# Patient Record
Sex: Male | Born: 1970 | Hispanic: Yes | Marital: Married | Smoking: Never smoker
Health system: Southern US, Community
[De-identification: ages and names within clinical notes are randomized; demographics above are authoritative.]

## PROBLEM LIST (undated history)

## (undated) DIAGNOSIS — I10 Essential (primary) hypertension: Secondary | ICD-10-CM

## (undated) HISTORY — PX: APPENDECTOMY: SHX54

---

## 2004-06-09 ENCOUNTER — Ambulatory Visit: Payer: Self-pay | Admitting: Otolaryngology

## 2005-05-27 ENCOUNTER — Ambulatory Visit: Payer: Self-pay | Admitting: Family Medicine

## 2007-02-21 IMAGING — CR DG ANKLE COMPLETE 3+V*L*
1 series · 6 of 6 positions shown · non-contrast
Comparison: none

REASON FOR EXAM: STATUS POST FALL, ANKLE PAIN
COMMENTS:   CALL REPORT TO [phone] OR FAX TO [phone]

PROCEDURE:     DXR - DXR ANKLE LEFT COMPLETE  - May 27, 2005 [DATE]
RESULT:     No fracture, dislocation or other bony abnormality is seen. The
ankle mortise is well maintained.

[Series 1: view not recorded · 0.17mm/px · 6 of 6 slices shown]
[im 1/6]
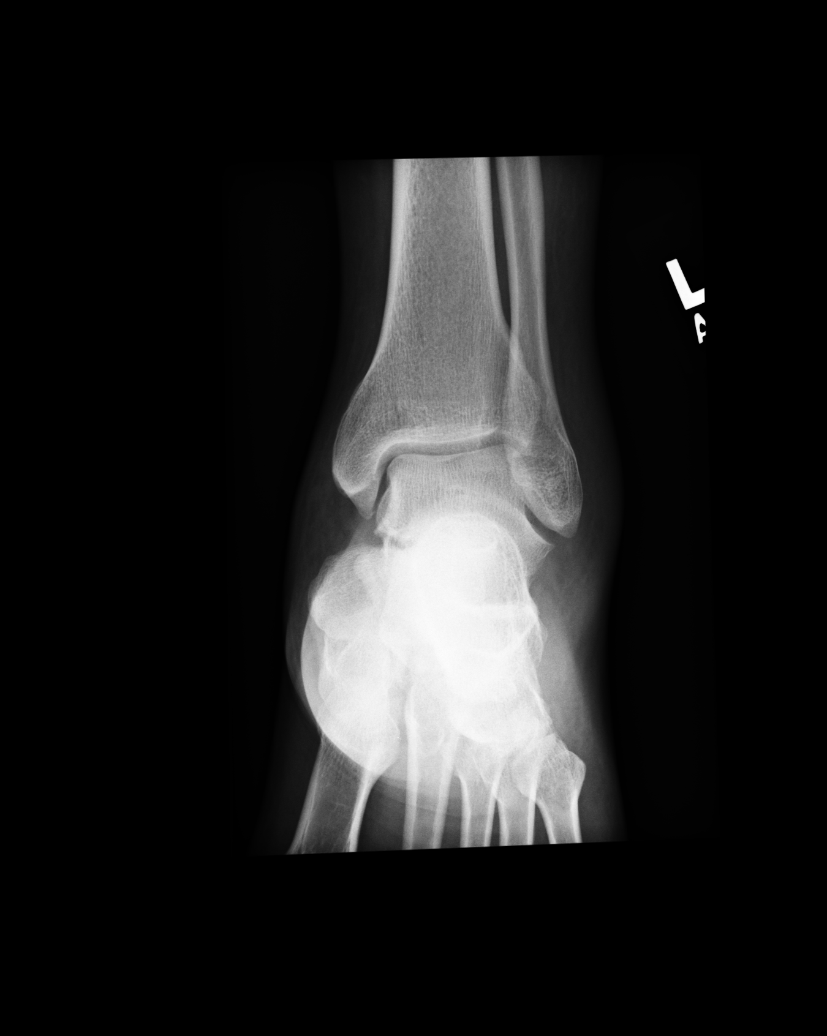
[im 2/6]
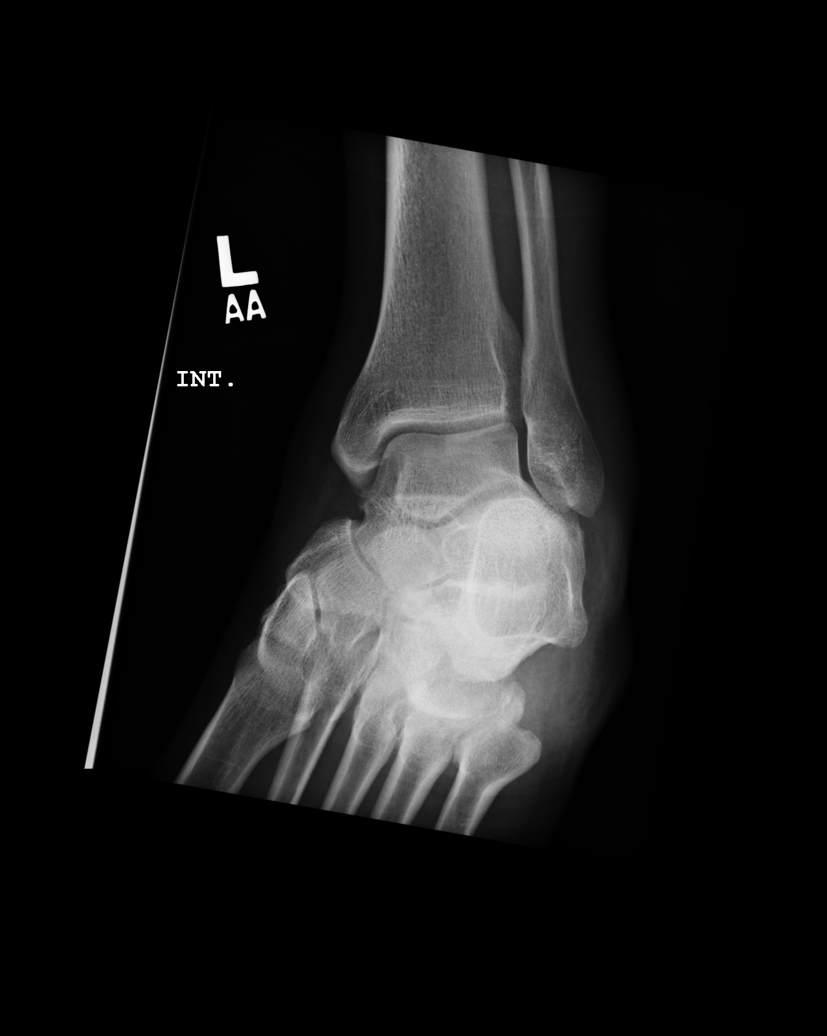
[im 3/6]
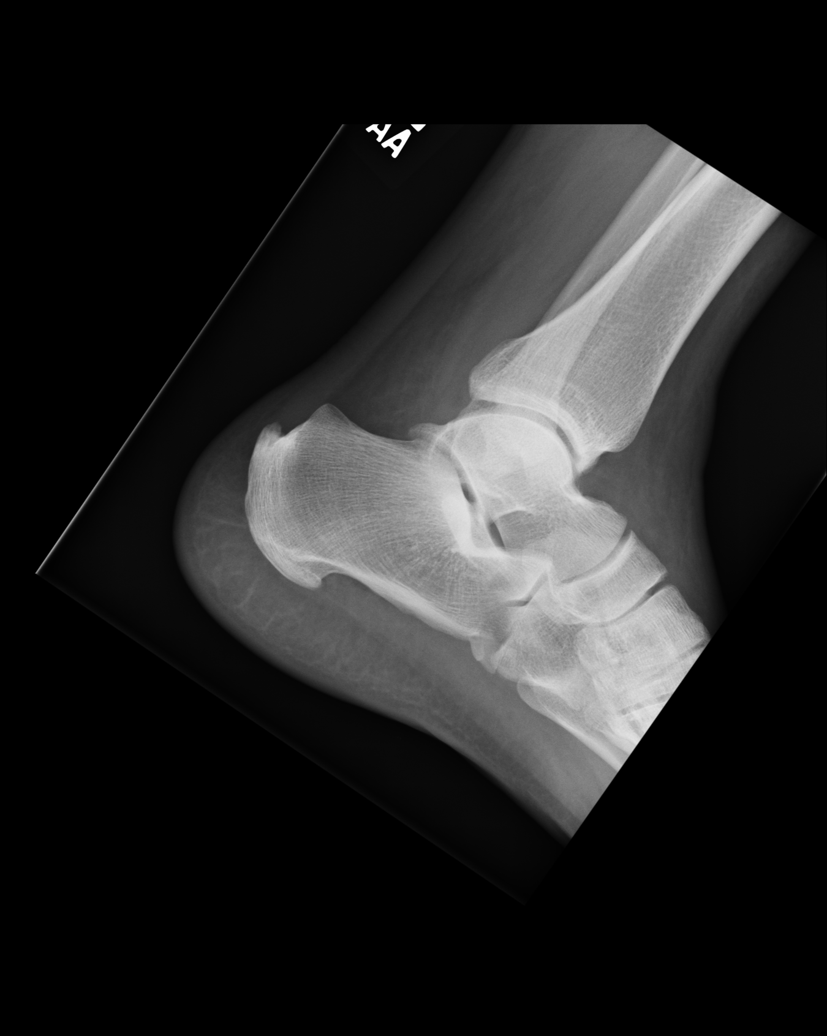
[im 4/6]
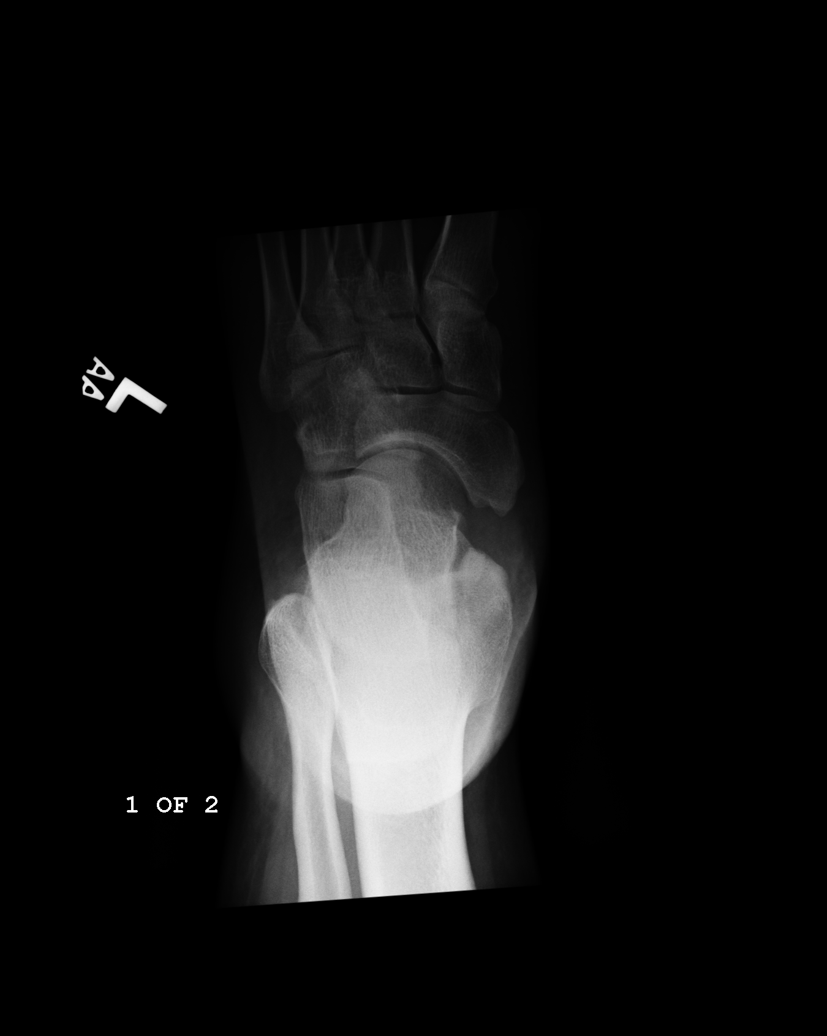
[im 5/6]
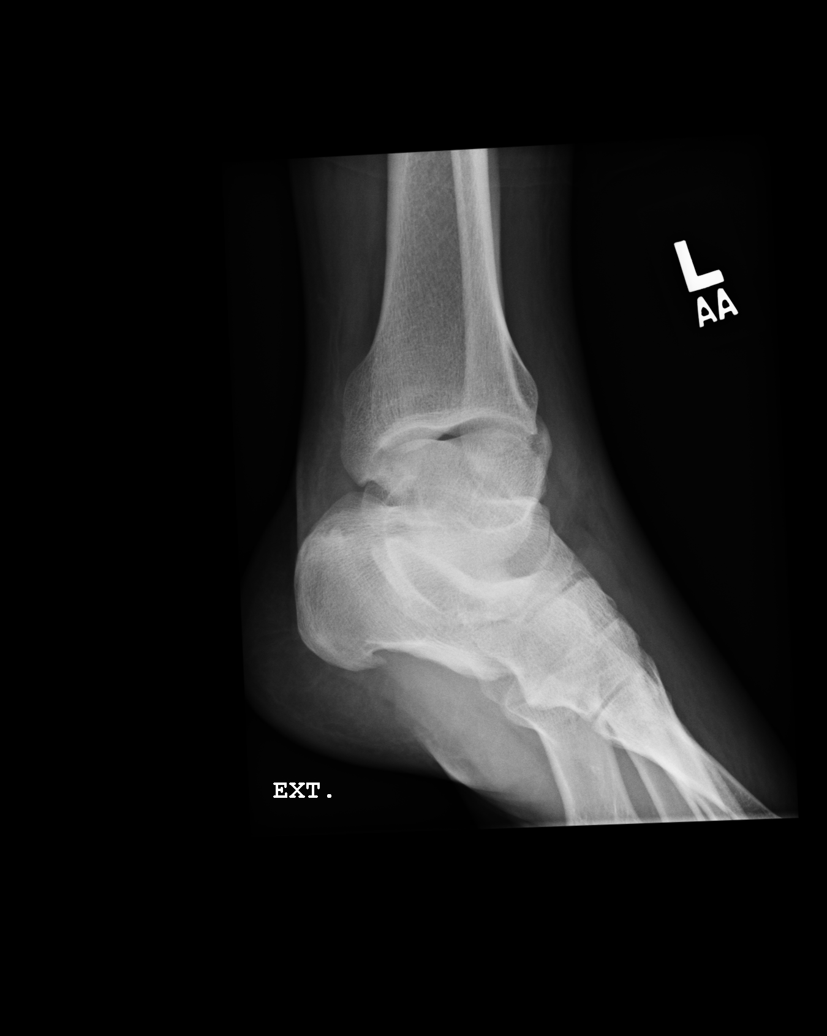
[im 6/6]
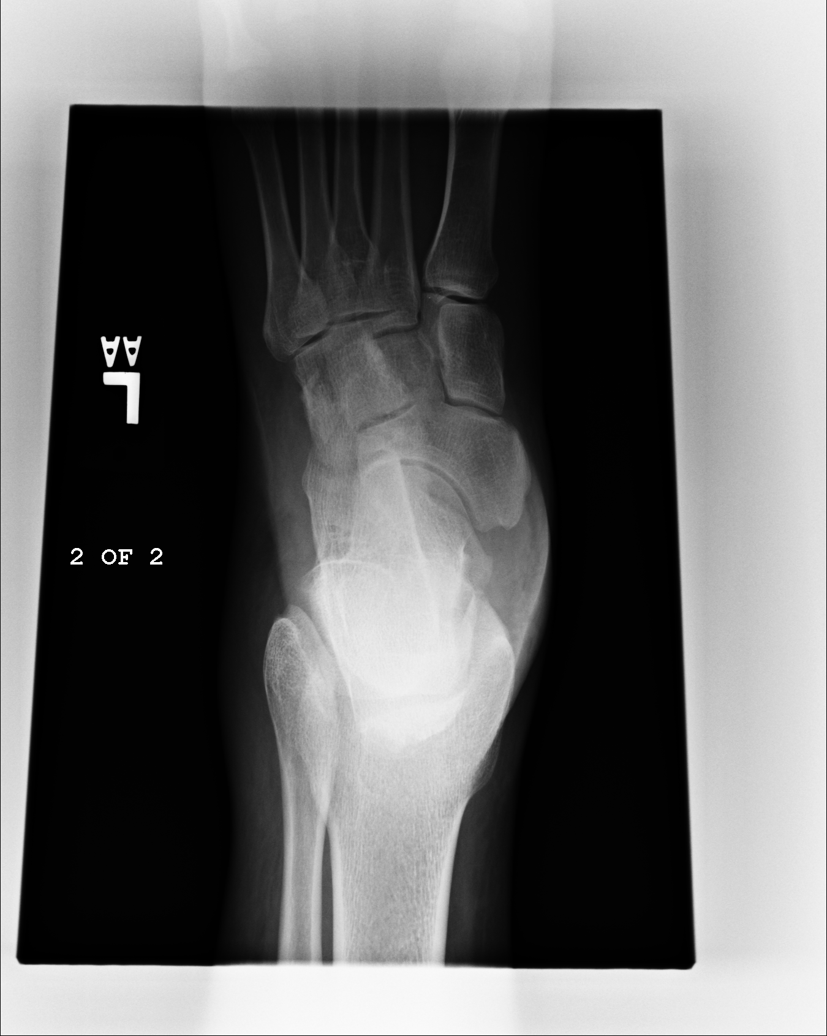

[6 of 6 positions shown; findings below may reference images not displayed]

IMPRESSION: No acute changes are identified.

## 2008-12-18 ENCOUNTER — Ambulatory Visit: Payer: Self-pay | Admitting: Family Medicine

## 2010-09-14 IMAGING — CR DG CHEST 1V
1 series · 1 of 1 positions shown · non-contrast
Comparison: none

REASON FOR EXAM: positive ppd
COMMENTS:

PROCEDURE:     DXR - DXR CHEST 1 VIEWAP OR PA  - December 18, 2008  [DATE]
RESULT:      A single view shows the lung markings are coarse. There is poor
inspiration. There is no cardiomegaly, edema or effusion. The bony
structures are unremarkable.

[view not recorded]
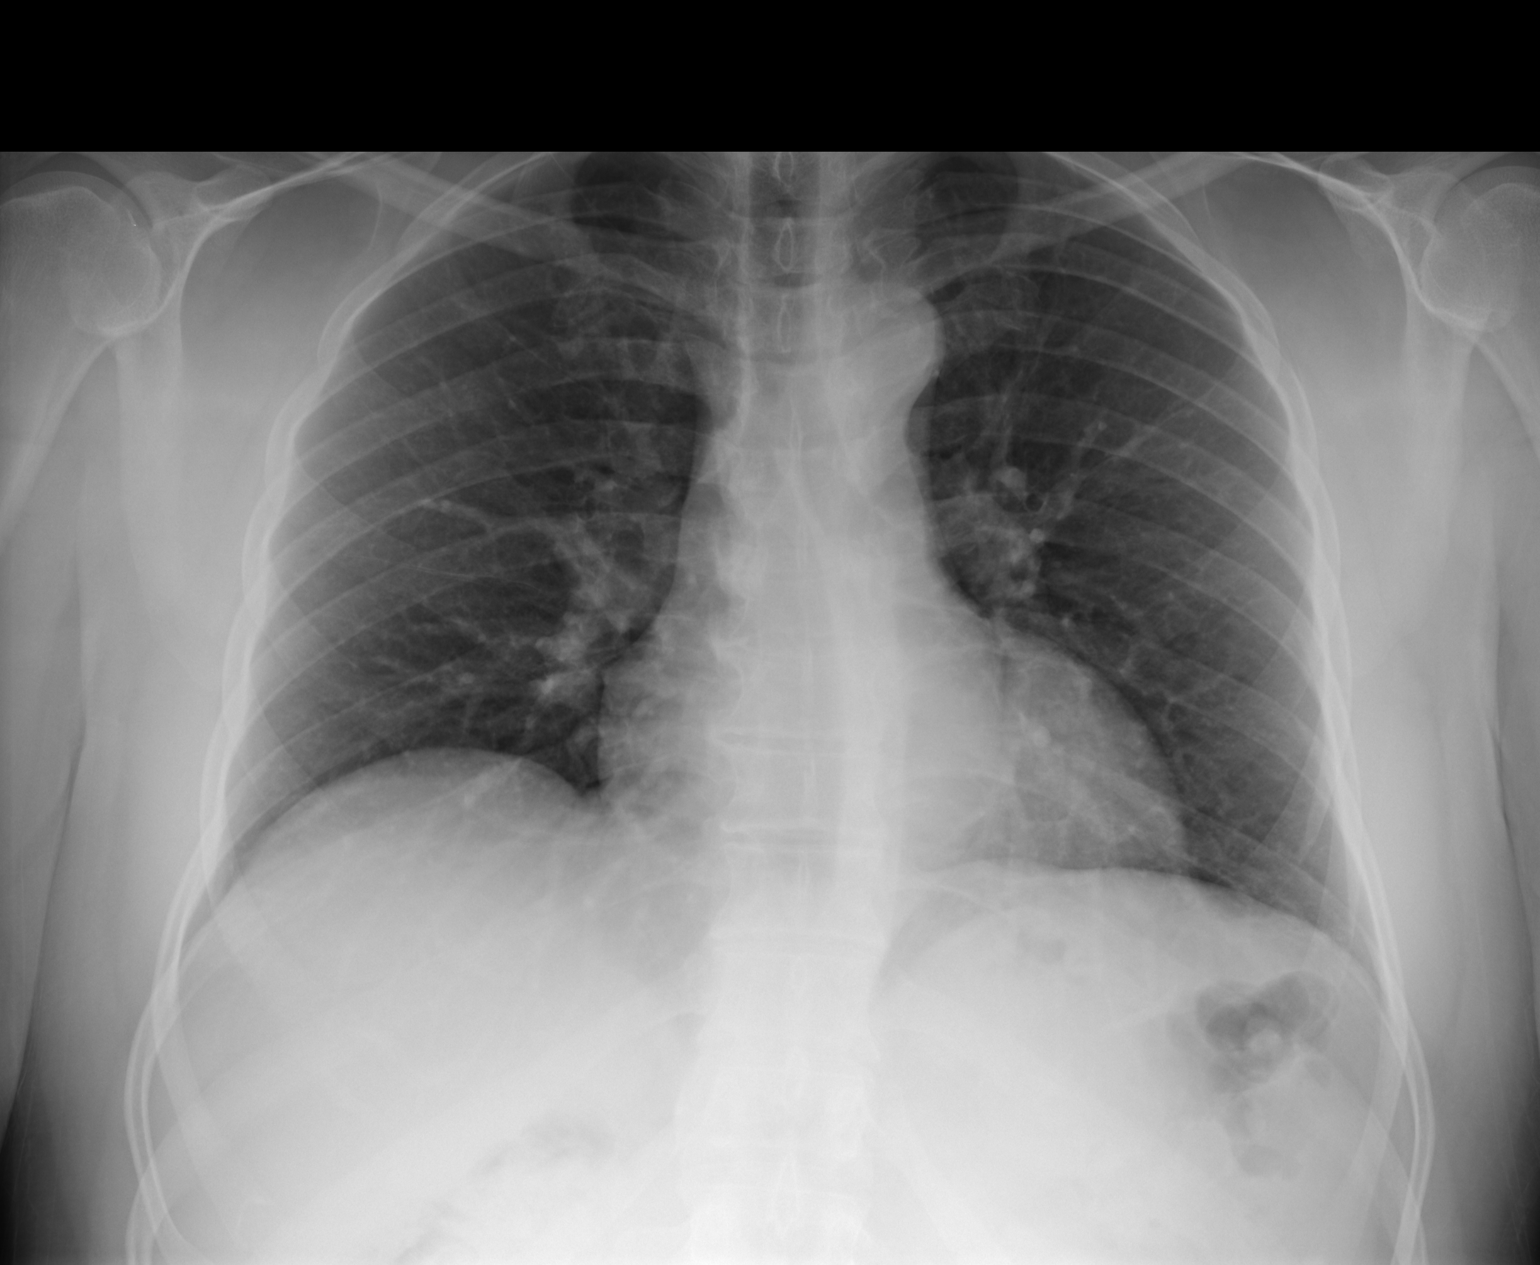

[1 of 1 positions shown; findings below may reference images not displayed]

IMPRESSION: Shallow inspiration with prominence of the lung markings. Followup PA and
lateral views with improved inspiration would be recommended.

## 2020-03-22 ENCOUNTER — Other Ambulatory Visit: Payer: Self-pay

## 2020-03-22 ENCOUNTER — Other Ambulatory Visit
Admission: RE | Admit: 2020-03-22 | Discharge: 2020-03-22 | Disposition: A | Discharge status: Home / Self Care | Payer: 59 | Source: Ambulatory Visit | Attending: Gastroenterology | Admitting: Gastroenterology

## 2020-03-22 DIAGNOSIS — Z01812 Encounter for preprocedural laboratory examination: Secondary | ICD-10-CM | POA: Diagnosis present

## 2020-03-22 DIAGNOSIS — U071 COVID-19: Secondary | ICD-10-CM | POA: Diagnosis not present

## 2020-03-22 LAB — SARS CORONAVIRUS 2 (TAT 6-24 HRS): SARS Coronavirus 2: POSITIVE — AB

## 2020-03-23 ENCOUNTER — Other Ambulatory Visit: Payer: 59

## 2020-03-26 ENCOUNTER — Ambulatory Visit: Admission: RE | Admit: 2020-03-26 | Payer: 59 | Source: Ambulatory Visit

## 2020-03-26 ENCOUNTER — Encounter: Admission: RE | Payer: Self-pay | Source: Ambulatory Visit

## 2020-03-26 HISTORY — DX: Essential (primary) hypertension: I10

## 2020-03-26 SURGERY — COLONOSCOPY WITH PROPOFOL
Anesthesia: General

## 2022-05-10 ENCOUNTER — Emergency Department (HOSPITAL_COMMUNITY): Payer: 59

## 2022-05-10 ENCOUNTER — Encounter (HOSPITAL_COMMUNITY): Payer: Self-pay | Admitting: Emergency Medicine

## 2022-05-10 ENCOUNTER — Inpatient Hospital Stay (HOSPITAL_COMMUNITY)
Admission: EM | Admit: 2022-05-10 | Discharge: 2022-05-15 | DRG: 516 | Disposition: A | Discharge status: Home Health | Payer: 59 | Attending: Surgery | Admitting: Surgery

## 2022-05-10 DIAGNOSIS — S32811A Multiple fractures of pelvis with unstable disruption of pelvic ring, initial encounter for closed fracture: Principal | ICD-10-CM | POA: Diagnosis present

## 2022-05-10 DIAGNOSIS — W3089XA Contact with other specified agricultural machinery, initial encounter: Secondary | ICD-10-CM | POA: Diagnosis not present

## 2022-05-10 DIAGNOSIS — S2232XA Fracture of one rib, left side, initial encounter for closed fracture: Secondary | ICD-10-CM | POA: Diagnosis present

## 2022-05-10 DIAGNOSIS — M545 Low back pain, unspecified: Secondary | ICD-10-CM | POA: Diagnosis not present

## 2022-05-10 DIAGNOSIS — R911 Solitary pulmonary nodule: Secondary | ICD-10-CM | POA: Diagnosis present

## 2022-05-10 DIAGNOSIS — Z6832 Body mass index (BMI) 32.0-32.9, adult: Secondary | ICD-10-CM | POA: Diagnosis not present

## 2022-05-10 DIAGNOSIS — S32048A Other fracture of fourth lumbar vertebra, initial encounter for closed fracture: Secondary | ICD-10-CM | POA: Diagnosis present

## 2022-05-10 DIAGNOSIS — S32810A Multiple fractures of pelvis with stable disruption of pelvic ring, initial encounter for closed fracture: Secondary | ICD-10-CM | POA: Diagnosis not present

## 2022-05-10 DIAGNOSIS — D62 Acute posthemorrhagic anemia: Secondary | ICD-10-CM | POA: Diagnosis not present

## 2022-05-10 DIAGNOSIS — S32111A Minimally displaced Zone I fracture of sacrum, initial encounter for closed fracture: Secondary | ICD-10-CM | POA: Diagnosis present

## 2022-05-10 DIAGNOSIS — I10 Essential (primary) hypertension: Secondary | ICD-10-CM | POA: Diagnosis present

## 2022-05-10 DIAGNOSIS — W309XXA Contact with unspecified agricultural machinery, initial encounter: Secondary | ICD-10-CM

## 2022-05-10 DIAGNOSIS — Z79899 Other long term (current) drug therapy: Secondary | ICD-10-CM

## 2022-05-10 DIAGNOSIS — S0031XA Abrasion of nose, initial encounter: Secondary | ICD-10-CM | POA: Diagnosis present

## 2022-05-10 DIAGNOSIS — Z23 Encounter for immunization: Secondary | ICD-10-CM

## 2022-05-10 DIAGNOSIS — E669 Obesity, unspecified: Secondary | ICD-10-CM | POA: Diagnosis present

## 2022-05-10 DIAGNOSIS — S3282XA Multiple fractures of pelvis without disruption of pelvic ring, initial encounter for closed fracture: Secondary | ICD-10-CM | POA: Diagnosis not present

## 2022-05-10 LAB — CBC
HCT: 40.7 % (ref 39.0–52.0)
Hemoglobin: 13 g/dL (ref 13.0–17.0)
MCH: 28.1 pg (ref 26.0–34.0)
MCHC: 31.9 g/dL (ref 30.0–36.0)
MCV: 87.9 fL (ref 80.0–100.0)
Platelets: 179 10*3/uL (ref 150–400)
RBC: 4.63 MIL/uL (ref 4.22–5.81)
RDW: 13.2 % (ref 11.5–15.5)
WBC: 13.2 10*3/uL — ABNORMAL HIGH (ref 4.0–10.5)
nRBC: 0 % (ref 0.0–0.2)

## 2022-05-10 LAB — COMPREHENSIVE METABOLIC PANEL
ALT: 64 U/L — ABNORMAL HIGH (ref 0–44)
AST: 67 U/L — ABNORMAL HIGH (ref 15–41)
Albumin: 3.8 g/dL (ref 3.5–5.0)
Alkaline Phosphatase: 64 U/L (ref 38–126)
Anion gap: 11 (ref 5–15)
BUN: 17 mg/dL (ref 6–20)
CO2: 24 mmol/L (ref 22–32)
Calcium: 8.7 mg/dL — ABNORMAL LOW (ref 8.9–10.3)
Chloride: 102 mmol/L (ref 98–111)
Creatinine, Ser: 1.29 mg/dL — ABNORMAL HIGH (ref 0.61–1.24)
GFR, Estimated: >60 mL/min (ref >60)
Glucose, Bld: 109 mg/dL — ABNORMAL HIGH (ref 70–99)
Potassium: 3.5 mmol/L (ref 3.5–5.1)
Sodium: 137 mmol/L (ref 135–145)
Total Bilirubin: 0.8 mg/dL (ref 0.3–1.2)
Total Protein: 6.5 g/dL (ref 6.5–8.1)

## 2022-05-10 LAB — URINALYSIS, ROUTINE W REFLEX MICROSCOPIC
Bilirubin Urine: NEGATIVE
Glucose, UA: NEGATIVE mg/dL
Ketones, ur: 40 mg/dL — AB
Leukocytes,Ua: NEGATIVE
Nitrite: NEGATIVE
Protein, ur: 30 mg/dL — AB
Specific Gravity, Urine: 1.01 (ref 1.005–1.030)
pH: 7 (ref 5.0–8.0)

## 2022-05-10 LAB — I-STAT CHEM 8, ED
BUN: 18 mg/dL (ref 6–20)
Calcium, Ion: 1.07 mmol/L — ABNORMAL LOW (ref 1.15–1.40)
Chloride: 105 mmol/L (ref 98–111)
Creatinine, Ser: 1.2 mg/dL (ref 0.61–1.24)
Glucose, Bld: 103 mg/dL — ABNORMAL HIGH (ref 70–99)
HCT: 40 % (ref 39.0–52.0)
Hemoglobin: 13.6 g/dL (ref 13.0–17.0)
Potassium: 3.5 mmol/L (ref 3.5–5.1)
Sodium: 142 mmol/L (ref 135–145)
TCO2: 26 mmol/L (ref 22–32)

## 2022-05-10 LAB — PROTIME-INR
INR: 1.2 (ref 0.8–1.2)
Prothrombin Time: 15 seconds (ref 11.4–15.2)

## 2022-05-10 LAB — MRSA NEXT GEN BY PCR, NASAL: MRSA by PCR Next Gen: NOT DETECTED

## 2022-05-10 LAB — URINALYSIS, MICROSCOPIC (REFLEX)

## 2022-05-10 LAB — LACTIC ACID, PLASMA: Lactic Acid, Venous: 2.1 mmol/L (ref 0.5–1.9)

## 2022-05-10 LAB — PREPARE RBC (CROSSMATCH)

## 2022-05-10 LAB — ABO/RH: ABO/RH(D): O POS

## 2022-05-10 LAB — ETHANOL: Alcohol, Ethyl (B): <10 mg/dL (ref <10)

## 2022-05-10 MED ORDER — LACTATED RINGERS IV SOLN: INTRAVENOUS | Status: DC

## 2022-05-10 MED ORDER — ONDANSETRON 4 MG PO TBDP: 4.0000 mg | ORAL_TABLET | Freq: Four times a day (QID) | ORAL | Status: DC | PRN

## 2022-05-10 MED ORDER — ORAL CARE MOUTH RINSE: 15.0000 mL | OROMUCOSAL | Status: DC | PRN

## 2022-05-10 MED ORDER — SODIUM CHLORIDE 0.9% IV SOLUTION: Freq: Once | INTRAVENOUS | Status: DC

## 2022-05-10 MED ORDER — HYDROMORPHONE HCL 1 MG/ML IJ SOLN
0.5000 mg | INTRAMUSCULAR | Status: DC | PRN
  Administered 2022-05-10 – 2022-05-12 (×8): 0.5 mg via INTRAVENOUS
  Filled 2022-05-10: qty 1
  Filled 2022-05-10: qty 0.5
  Filled 2022-05-10: qty 1
  Filled 2022-05-10: qty 0.5
  Filled 2022-05-10: qty 1
  Filled 2022-05-10 (×3): qty 0.5
  Filled 2022-05-10: qty 1

## 2022-05-10 MED ORDER — CHLORHEXIDINE GLUCONATE CLOTH 2 % EX PADS
6.0000 | MEDICATED_PAD | Freq: Every day | CUTANEOUS | Status: DC
  Administered 2022-05-10 – 2022-05-12 (×3): 6 via TOPICAL

## 2022-05-10 MED ORDER — ACETAMINOPHEN 500 MG PO TABS
1000.0000 mg | ORAL_TABLET | Freq: Three times a day (TID) | ORAL | Status: DC
  Administered 2022-05-10 – 2022-05-13 (×9): 1000 mg via ORAL
  Filled 2022-05-10 (×10): qty 2

## 2022-05-10 MED ORDER — LACTATED RINGERS IV BOLUS
1000.0000 mL | Freq: Once | INTRAVENOUS | Status: AC
  Administered 2022-05-10: 1000 mL via INTRAVENOUS

## 2022-05-10 MED ORDER — FENTANYL CITRATE PF 50 MCG/ML IJ SOSY
50.0000 ug | PREFILLED_SYRINGE | Freq: Once | INTRAMUSCULAR | Status: AC
  Administered 2022-05-10: 50 ug via INTRAVENOUS

## 2022-05-10 MED ORDER — TETANUS-DIPHTH-ACELL PERTUSSIS 5-2.5-18.5 LF-MCG/0.5 IM SUSY
0.5000 mL | PREFILLED_SYRINGE | Freq: Once | INTRAMUSCULAR | Status: AC
  Administered 2022-05-10: 0.5 mL via INTRAMUSCULAR
  Filled 2022-05-10: qty 0.5

## 2022-05-10 MED ORDER — FENTANYL CITRATE (PF) 100 MCG/2ML IJ SOLN
INTRAMUSCULAR | Status: AC
  Filled 2022-05-10: qty 2

## 2022-05-10 MED ORDER — SODIUM CHLORIDE 0.9 % IV SOLN
INTRAVENOUS | Status: AC | PRN
  Administered 2022-05-10: 1000 mL via INTRAVENOUS

## 2022-05-10 MED ORDER — METHOCARBAMOL 500 MG PO TABS
1000.0000 mg | ORAL_TABLET | Freq: Four times a day (QID) | ORAL | Status: DC
  Administered 2022-05-10 – 2022-05-15 (×18): 1000 mg via ORAL
  Filled 2022-05-10 (×18): qty 2

## 2022-05-10 MED ORDER — DOCUSATE SODIUM 100 MG PO CAPS
100.0000 mg | ORAL_CAPSULE | Freq: Two times a day (BID) | ORAL | Status: DC
  Administered 2022-05-10 – 2022-05-11 (×3): 100 mg via ORAL
  Filled 2022-05-10 (×4): qty 1

## 2022-05-10 MED ORDER — ONDANSETRON HCL 4 MG/2ML IJ SOLN: 4.0000 mg | Freq: Four times a day (QID) | INTRAMUSCULAR | Status: DC | PRN

## 2022-05-10 MED ORDER — SODIUM CHLORIDE 0.9% IV SOLUTION: Freq: Once | INTRAVENOUS | Status: AC

## 2022-05-10 MED ORDER — ENOXAPARIN SODIUM 30 MG/0.3ML IJ SOSY
30.0000 mg | PREFILLED_SYRINGE | Freq: Two times a day (BID) | INTRAMUSCULAR | Status: DC
  Administered 2022-05-12 – 2022-05-15 (×6): 30 mg via SUBCUTANEOUS
  Filled 2022-05-10 (×7): qty 0.3

## 2022-05-10 MED ORDER — IOHEXOL 350 MG/ML SOLN
75.0000 mL | Freq: Once | INTRAVENOUS | Status: AC | PRN
  Administered 2022-05-10: 75 mL via INTRAVENOUS

## 2022-05-10 MED ORDER — OXYCODONE HCL 5 MG PO TABS
5.0000 mg | ORAL_TABLET | ORAL | Status: DC | PRN
  Administered 2022-05-10 – 2022-05-15 (×21): 10 mg via ORAL
  Filled 2022-05-10 (×23): qty 2

## 2022-05-10 MED ORDER — FENTANYL CITRATE PF 50 MCG/ML IJ SOSY
PREFILLED_SYRINGE | INTRAMUSCULAR | Status: AC | PRN
  Administered 2022-05-10 (×2): 50 ug via INTRAVENOUS

## 2022-05-10 MED ORDER — LIDOCAINE HCL (PF) 1 % IJ SOLN
INTRAMUSCULAR | Status: AC
  Filled 2022-05-10: qty 30

## 2022-05-10 NOTE — ED Notes (Signed)
Patient transported to CT 

## 2022-05-10 NOTE — ED Notes (Signed)
Pt's BP dropped down to the 70s. EDP and Trauma MD both made aware. 1 liter of fluids. 1 unit of blood ordered.

## 2022-05-10 NOTE — ED Notes (Signed)
[place] contact person

## 2022-05-10 NOTE — ED Notes (Signed)
Trauma Response Nurse Documentation   Matthew Gomez is a 52 y.o. male arriving to [facility] ED via [facility] Gomez  On No antithrombotic. Trauma was activated as a Level 2 by Matthew Gomez. Upgraded to Level 1 by TRN-- due to hypotension in field based on the following trauma criteria Stable femur, humerus, or pelvic fracture via any mechanism except GLF. Trauma team at the bedside on patient arrival.   Patient cleared for CT by Matthew Gomez. Pt transported to CT with trauma response nurse and primary RN Matthew Gomez present to monitor. RN remained with the patient throughout their absence from the department for clinical observation.   GCS 15.  History   Past Medical History:  Diagnosis Date   Hypertension      Past Surgical History:  Procedure Laterality Date   APPENDECTOMY         Initial Focused Assessment (If applicable, or please see trauma documentation):  Airway- clear Breathing- unlabored,  Circulation-- no bleeding/bruising noted-- moderate peripheral pulses GCS -- remains 15  CT's Completed:   CT Head, CT C-Spine, CT Chest w/ contrast, and CT abdomen/pelvis w/ contrast   Interventions:   Plan for disposition:  Admission to Progressive Care   Consults completed:  Orthopaedic Surgeon at 1715-- texted by Matthew Ina, RN from OR- then Matthew Gomez made a formal consult. Matthew Gomez at bedside at 1730.  Event Summary:  See trauma MD and EDP notes.     Bedside handoff with ED RN Matthew Gomez.    Matthew Gomez  Trauma Response RN  Please call TRN at [phone] for further assistance.

## 2022-05-10 NOTE — ED Notes (Signed)
ED TO INPATIENT HANDOFF REPORT  ED Nurse Name and Phone #: Sherryll Burger [phone]  S Name/Age/Gender Matthew Gomez 52 y.o. male Room/Bed: TRAAC/TRAAC  Code Status   Code Status: Full Code  Home/SNF/Other Home Patient oriented to: self, place, time, and situation Is this baseline? Yes   Triage Complete: Triage complete  Chief Complaint Multiple closed pelvic fractures with disruption of pelvic circle ([place]) [S32.810A]  Triage Note PT BIB [facility] after having a full size tractor got stuck and when trying to get it out, fell on top of patient. Patient then pulled out by bystanders. Pt denies LOC.   Pelvis has L sided crepitus. PMS intact.   750 NS en route  110/70 to 90/60  18 g LAC 16 g RAC   Allergies No Known Allergies  Level of Care/Admitting Diagnosis ED Disposition     ED Disposition  Admit   Condition  --   Comment  Hospital Area: [place] [[id number]]  Level of Care: Progressive [102]  Admit to Progressive based on following criteria: MULTISYSTEM THREATS such as stable sepsis, metabolic/electrolyte imbalance with or without encephalopathy that is responding to early treatment.  May admit patient to [name] or [facility] if equivalent level of care is available:: No  Covid Evaluation: Asymptomatic - no recent exposure (last 10 days) testing not required  Diagnosis: Multiple closed pelvic fractures with disruption of pelvic circle ([place]) [id number].ICD-9-CM]  Admitting Physician: Dwan Bolt [id number]  Attending Physician: Dwan Bolt [id number]  Bed request comments: 4NP  Certification:: I certify this patient will need inpatient services for at least 2 midnights  Estimated Length of Stay: 5          B Medical/Surgery History Past Medical History:  Diagnosis Date   Hypertension    Past Surgical History:  Procedure Laterality Date   APPENDECTOMY       A IV Location/Drains/Wounds Patient Lines/Drains/Airways Status      Active Line/Drains/Airways     Name Placement date Placement time Site Days   Peripheral IV 05/10/22 18 G Anterior;Distal;Left;Upper Arm 05/10/22  --  Arm  less than 1   Peripheral IV 05/10/22 16 G Right Antecubital 05/10/22  --  Antecubital  less than 1            Intake/Output Last 24 hours No intake or output data in the 24 hours ending 05/10/22 1807  Labs/Imaging Results for orders placed or performed during the hospital encounter of 05/10/22 (from the past 48 hour(s))  Comprehensive metabolic panel     Status: Abnormal   Collection Time: 05/10/22  4:50 PM  Result Value Ref Range   Sodium 137 135 - 145 mmol/L   Potassium 3.5 3.5 - 5.1 mmol/L   Chloride 102 98 - 111 mmol/L   CO2 24 22 - 32 mmol/L   Glucose, Bld 109 (H) 70 - 99 mg/dL    Comment: Glucose reference range applies only to samples taken after fasting for at least 8 hours.   BUN 17 6 - 20 mg/dL   Creatinine, Ser 1.29 (H) 0.61 - 1.24 mg/dL   Calcium 8.7 (L) 8.9 - 10.3 mg/dL   Total Protein 6.5 6.5 - 8.1 g/dL   Albumin 3.8 3.5 - 5.0 g/dL   AST 67 (H) 15 - 41 U/L   ALT 64 (H) 0 - 44 U/L   Alkaline Phosphatase 64 38 - 126 U/L   Total Bilirubin 0.8 0.3 - 1.2 mg/dL   GFR, Estimated >60 >  60 mL/min    Comment: (NOTE) Calculated using the CKD-EPI Creatinine Equation (2021)    Anion gap 11 5 - 15    Comment: Performed at [facility], [place] [address] [place], [place] [address]  CBC     Status: Abnormal   Collection Time: 05/10/22  4:50 PM  Result Value Ref Range   WBC 13.2 (H) 4.0 - 10.5 K/uL   RBC 4.63 4.22 - 5.81 MIL/uL   Hemoglobin 13.0 13.0 - 17.0 g/dL   HCT 40.7 39.0 - 52.0 %   MCV 87.9 80.0 - 100.0 fL   MCH 28.1 26.0 - 34.0 pg   MCHC 31.9 30.0 - 36.0 g/dL   RDW 13.2 11.5 - 15.5 %   Platelets 179 150 - 400 K/uL   nRBC 0.0 0.0 - 0.2 %    Comment: Performed at [facility], [place] [address], [place], [place] [address]  Ethanol     Status: None   Collection Time: 05/10/22  4:50 PM  Result  Value Ref Range   Alcohol, Ethyl (B) <10 <10 mg/dL    Comment: (NOTE) Lowest detectable limit for serum alcohol is 10 mg/dL.  For medical purposes only. Performed at [facility], [place] [address] [place], [place] [address]   Protime-INR     Status: None   Collection Time: 05/10/22  4:50 PM  Result Value Ref Range   Prothrombin Time 15.0 11.4 - 15.2 seconds   INR 1.2 0.8 - 1.2    Comment: (NOTE) INR goal varies based on device and disease states. Performed at [facility], [place] [address], [place], [place] [address]   Type and screen [place]     Status: None   Collection Time: 05/10/22  4:58 PM  Result Value Ref Range   ABO/RH(D) O POS    Antibody Screen NEG    Sample Expiration      05/13/2022,2359 Performed at [facility], [place] [address] [place], [place] [address]   I-Stat Chem 8, ED     Status: Abnormal   Collection Time: 05/10/22  5:00 PM  Result Value Ref Range   Sodium 142 135 - 145 mmol/L   Potassium 3.5 3.5 - 5.1 mmol/L   Chloride 105 98 - 111 mmol/L   BUN 18 6 - 20 mg/dL   Creatinine, Ser 1.20 0.61 - 1.24 mg/dL   Glucose, Bld 103 (H) 70 - 99 mg/dL    Comment: Glucose reference range applies only to samples taken after fasting for at least 8 hours.   Calcium, Ion 1.07 (L) 1.15 - 1.40 mmol/L   TCO2 26 22 - 32 mmol/L   Hemoglobin 13.6 13.0 - 17.0 g/dL   HCT 40.0 39.0 - 52.0 %   DG Chest Port 1 View  Result Date: 05/10/2022 CLINICAL DATA:  Run over by tractor, initial encounter EXAM: PORTABLE CHEST 1 VIEW COMPARISON:  12/18/2008 FINDINGS: Cardiac shadow is enlarged but accentuated by the portable technique. The overall inspiratory effort is poor although no focal infiltrate is seen. No effusion or pneumothorax is noted. No acute bony abnormality is seen. IMPRESSION: No active disease. Electronically Signed   By: Inez Catalina M.D.   On: 05/10/2022 17:06    Pending Labs Unresulted Labs (From admission, onward)     Start      Ordered   05/11/22 0500  CBC  Tomorrow morning,   R        05/10/22  1747   05/11/22 [id number]  Basic metabolic panel  Tomorrow morning,   R        05/10/22 1747   05/10/22 1744  HIV Antibody (routine testing w rflx)  (HIV Antibody (Routine testing w reflex) panel)  Once,   R        05/10/22 1747   05/10/22 1650  Urinalysis, Routine w reflex microscopic -Urine, Random  (Trauma Panel)  Once,   URGENT       Question:  Specimen Source  Answer:  Urine, Random   05/10/22 1651   05/10/22 1650  Lactic acid, plasma  (Trauma Panel)  Once,   STAT        05/10/22 1651            Vitals/Pain Today's Vitals   05/10/22 1656 05/10/22 1700 05/10/22 1745 05/10/22 1750  BP:  (!) 142/126 (!) 106/91 121/65  Pulse:  70 76 73  Resp:  18 12 14  $ Temp: (!) 95.5 F (35.3 C)  97.8 F (36.6 C) 98.1 F (36.7 C)  SpO2:  100% 100% 99%  PainSc:        Isolation Precautions No active isolations  Medications Medications  Tdap (BOOSTRIX) injection 0.5 mL (has no administration in time range)  fentaNYL (SUBLIMAZE) injection (50 mcg Intravenous Given 05/10/22 1709)  fentaNYL (SUBLIMAZE) 100 MCG/2ML injection (has no administration in time range)  0.9 %  sodium chloride infusion (1,000 mLs Intravenous New Bag/Given 05/10/22 1653)  fentaNYL (SUBLIMAZE) 100 MCG/2ML injection (has no administration in time range)  enoxaparin (LOVENOX) injection 30 mg (has no administration in time range)  lactated ringers infusion (has no administration in time range)  acetaminophen (TYLENOL) tablet 1,000 mg (has no administration in time range)  oxyCODONE (Oxy IR/ROXICODONE) immediate release tablet 5-10 mg (has no administration in time range)  HYDROmorphone (DILAUDID) injection 0.5 mg (has no administration in time range)  methocarbamol (ROBAXIN) tablet 1,000 mg (has no administration in time range)  docusate sodium (COLACE) capsule 100 mg (has no administration in time range)  ondansetron (ZOFRAN-ODT) disintegrating tablet 4 mg  (has no administration in time range)    Or  ondansetron (ZOFRAN) injection 4 mg (has no administration in time range)  lidocaine (PF) (XYLOCAINE) 1 % injection (has no administration in time range)  fentaNYL (SUBLIMAZE) injection 50 mcg (50 mcg Intravenous Given 05/10/22 1759)    Mobility non-ambulatory     Focused Assessments    R Recommendations: See Admitting Provider Note  Report given to:   Additional Notes: pt is on traction.

## 2022-05-10 NOTE — ED Provider Notes (Signed)
[facility] Provider Note  CSN: [id number] Arrival date & time: 05/10/22 1651  Chief Complaint(s) Trauma  HPI Matthew Gomez is a 52 y.o. male with history of hypertension senting to the emergency department after a tractor injury.  The patient reports that he was trying to roll over a tractor that was stuck in the mud when it rolled over and landed on him.  EMS was called.  He reports that he has pain in his low back, bilateral hips.  Reports the pain is severe.  No headaches, nausea vomiting, chest pain, shortness of breath, pain in his arms, pain in his legs.  He had 1 episode of low blood pressure to 90 with EMS but that improved on recheck to [id number] systolic.   Past Medical History Past Medical History:  Diagnosis Date   Hypertension    Patient Active Problem List   Diagnosis Date Noted   Multiple closed pelvic fractures with disruption of pelvic circle ([place]) 05/10/2022   Home Medication(s) Prior to Admission medications   Medication Sig Start Date End Date Taking? Authorizing Provider  meloxicam (MOBIC) 15 MG tablet Take 15 mg by mouth daily.    [provider]  Multiple Vitamin (MULTIVITAMIN) tablet Take 1 tablet by mouth daily.    [provider]  telmisartan-hydrochlorothiazide (MICARDIS HCT) 40-12.5 MG tablet Take 1 tablet by mouth daily.    [provider]                                                                                                                                    Past Surgical History Past Surgical History:  Procedure Laterality Date   APPENDECTOMY     Family History No family history on file.  Social History Social History   Tobacco Use   Smoking status: Never   Smokeless tobacco: Never   Allergies Patient has no known allergies.  Review of Systems Review of Systems  All other systems reviewed and are negative.   Physical Exam Vital Signs  I have reviewed the  triage vital signs BP (!) 142/126   Pulse 70   Temp (!) 95.5 F (35.3 C)   Resp 18   SpO2 100%  Physical Exam Vitals and nursing note reviewed.  Constitutional:      General: He is not in acute distress.    Appearance: Normal appearance.  HENT:     Head: Normocephalic.     Comments: Small less than 1 cm abrasion to the bridge of the nose.    Right Ear: External ear normal.     Left Ear: External ear normal.     Nose:     Comments: No nasal septal hematoma.    Mouth/Throat:     Mouth: Mucous membranes are moist.  Eyes:     Conjunctiva/sclera: Conjunctivae normal.     Pupils: Pupils are equal, round, and reactive to light.  Neck:     Comments: C-collar in place Cardiovascular:     Rate and Rhythm: Normal rate and regular rhythm.     Heart sounds: No murmur heard. Pulmonary:     Effort: Pulmonary effort is normal. No respiratory distress.     Breath sounds: Normal breath sounds.  Chest:     Chest wall: No tenderness.  Abdominal:     General: Abdomen is flat.     Palpations: Abdomen is soft.     Tenderness: There is abdominal tenderness (mild lower).  Musculoskeletal:     Right lower leg: No edema.     Left lower leg: No edema.     Comments: No chest wall tenderness or crepitus.  No focal tenderness or deformity to the bilateral upper extremities, full range of motion at the bilateral shoulders, elbows, wrists and hand.  Range of motion intact of the bilateral hips, knees, ankles and toes, although painful range of motion at the bilateral hips.  Significant tenderness to palpation in the pelvis bilaterally.  Mild midline lumbar tenderness without step-off.  No thoracic tenderness.  Skin:    General: Skin is warm and dry.     Capillary Refill: Capillary refill takes less than 2 seconds.  Neurological:     Mental Status: He is alert and oriented to person, place, and time. Mental status is at baseline.  Psychiatric:        Mood and Affect: Mood normal.        Behavior:  Behavior normal.     ED Results and Treatments Labs (all labs ordered are listed, but only abnormal results are displayed) Labs Reviewed  CBC - Abnormal; Notable for the following components:      Result Value   WBC 13.2 (*)    All other components within normal limits  I-STAT CHEM 8, ED - Abnormal; Notable for the following components:   Glucose, Bld 103 (*)    Calcium, Ion 1.07 (*)    All other components within normal limits  PROTIME-INR  COMPREHENSIVE METABOLIC PANEL  ETHANOL  URINALYSIS, ROUTINE W REFLEX MICROSCOPIC  LACTIC ACID, PLASMA  HIV ANTIBODY (ROUTINE TESTING W REFLEX)  CBC  BASIC METABOLIC PANEL  TYPE AND SCREEN                                                                                                                          Radiology DG Chest Port 1 View  Result Date: 05/10/2022 CLINICAL DATA:  Run over by tractor, initial encounter EXAM: PORTABLE CHEST 1 VIEW COMPARISON:  12/18/2008 FINDINGS: Cardiac shadow is enlarged but accentuated by the portable technique. The overall inspiratory effort is poor although no focal infiltrate is seen. No effusion or pneumothorax is noted. No acute bony abnormality is seen. IMPRESSION: No active disease. Electronically Signed   By: Inez Catalina M.D.   On: 05/10/2022 17:06    Pertinent labs & imaging results that were available during my care of the patient were  reviewed by me and considered in my medical decision making (see MDM for details).  Medications Ordered in ED Medications  Tdap (BOOSTRIX) injection 0.5 mL (has no administration in time range)  fentaNYL (SUBLIMAZE) injection (50 mcg Intravenous Given 05/10/22 1709)  fentaNYL (SUBLIMAZE) 100 MCG/2ML injection (has no administration in time range)  0.9 %  sodium chloride infusion (1,000 mLs Intravenous New Bag/Given 05/10/22 1653)  fentaNYL (SUBLIMAZE) 100 MCG/2ML injection (has no administration in time range)  fentaNYL (SUBLIMAZE) injection 50 mcg (has no  administration in time range)  enoxaparin (LOVENOX) injection 30 mg (has no administration in time range)  lactated ringers infusion (has no administration in time range)  acetaminophen (TYLENOL) tablet 1,000 mg (has no administration in time range)  oxyCODONE (Oxy IR/ROXICODONE) immediate release tablet 5-10 mg (has no administration in time range)  HYDROmorphone (DILAUDID) injection 0.5 mg (has no administration in time range)  methocarbamol (ROBAXIN) tablet 1,000 mg (has no administration in time range)  docusate sodium (COLACE) capsule 100 mg (has no administration in time range)  ondansetron (ZOFRAN-ODT) disintegrating tablet 4 mg (has no administration in time range)    Or  ondansetron (ZOFRAN) injection 4 mg (has no administration in time range)  lidocaine (PF) (XYLOCAINE) 1 % injection (has no administration in time range)                                                                                                                                     Procedures .Critical Care  Performed by: Cristie Hem, MD Authorized by: Cristie Hem, MD   Critical care provider statement:    Critical care time (minutes):  30   Critical care was necessary to treat or prevent imminent or life-threatening deterioration of the following conditions:  Trauma   Critical care was time spent personally by me on the following activities:  Development of treatment plan with patient or surrogate, discussions with consultants, evaluation of patient's response to treatment, examination of patient, ordering and review of laboratory studies, ordering and review of radiographic studies, ordering and performing treatments and interventions, pulse oximetry, re-evaluation of patient's condition and review of old charts   Care discussed with: admitting provider     (including critical care time)  Medical Decision Making / ED Course   MDM:  52 year old male presenting to the emergency department  after tractor injury.  Patient well-appearing, vital signs reassuring, not hypotensive in trauma bay, no tachycardia.  Patient is uncomfortable appearing.  Examination notable for painful range of motion of bilateral hips, tenderness to the pelvis bilaterally, bedside x-ray shows a left iliac fracture as well as superior and inferior pelvic rami fracture on the right.  He does have some widening of his pubic symphysis.  Patient upgraded to level 1 trauma given reported low blood pressure with EMS.  Given mechanism of injury we will proceed with CT scans of the head and neck, chest, abdomen  and pelvis to further characterize injuries.  He also has some lower abdominal tenderness although this is likely related to patient may need IR treatment if he becomes hypotensive.  Pelvic binder is at bedside and will place if patient develops any hemodynamic instability.  Discussed with Dr. [name] with general surgery who agrees with deferring pelvic binder unless patient develops hemodynamic instability.  Given unstable pelvic fracture will likely need to be admitted for further management and monitoring. Clinical Course as of 05/10/22 1758  Sat May 10, 2022  1756 Patient admitted to trauma service. Pending CT scan results. [WS]    Clinical Course User Index [WS] Cristie Hem, MD     Additional history obtained: -Additional history obtained from ems -External records from outside source obtained and reviewed including: Chart review including previous notes, labs, imaging, consultation notes including office visit 01/17/22   Lab Tests: -I ordered, reviewed, and interpreted labs.   The pertinent results include:   Labs Reviewed  CBC - Abnormal; Notable for the following components:      Result Value   WBC 13.2 (*)    All other components within normal limits  I-STAT CHEM 8, ED - Abnormal; Notable for the following components:   Glucose, Bld 103 (*)    Calcium, Ion 1.07 (*)    All other  components within normal limits  PROTIME-INR  COMPREHENSIVE METABOLIC PANEL  ETHANOL  URINALYSIS, ROUTINE W REFLEX MICROSCOPIC  LACTIC ACID, PLASMA  HIV ANTIBODY (ROUTINE TESTING W REFLEX)  CBC  BASIC METABOLIC PANEL  TYPE AND SCREEN    Notable for leukocytosis, likely reactive, normal hemoglobin  EKG   EKG Interpretation  Date/Time:  Saturday May 10 2022 17:32:25 EST Ventricular Rate:  77 PR Interval:  183 QRS Duration: 95 QT Interval:  397 QTC Calculation: 450 R Axis:   48 Text Interpretation: Sinus rhythm Borderline T wave abnormalities Confirmed by Garnette Gunner [phone]) on 05/10/2022 5:33:49 PM         Imaging Studies ordered: I ordered imaging studies including XR pelvis, XR chest On my interpretation imaging demonstrates multiple pelvic fractures I independently visualized and interpreted imaging. I agree with the radiologist interpretation   Medicines ordered and prescription drug management: Meds ordered this encounter  Medications   Tdap (BOOSTRIX) injection 0.5 mL   fentaNYL (SUBLIMAZE) injection   fentaNYL (SUBLIMAZE) 100 MCG/2ML injection    Kevan Rosebush Y: cabinet override   0.9 %  sodium chloride infusion   fentaNYL (SUBLIMAZE) 100 MCG/2ML injection    Kevan Rosebush Y: cabinet override   fentaNYL (SUBLIMAZE) injection 50 mcg   enoxaparin (LOVENOX) injection 30 mg   lactated ringers infusion   acetaminophen (TYLENOL) tablet 1,000 mg   oxyCODONE (Oxy IR/ROXICODONE) immediate release tablet 5-10 mg   HYDROmorphone (DILAUDID) injection 0.5 mg   methocarbamol (ROBAXIN) tablet 1,000 mg   docusate sodium (COLACE) capsule 100 mg   OR Linked Order Group    ondansetron (ZOFRAN-ODT) disintegrating tablet 4 mg    ondansetron (ZOFRAN) injection 4 mg   lidocaine (PF) (XYLOCAINE) 1 % injection    Anello, Elexes A: cabinet override    -I have reviewed the patients home medicines and have made adjustments as needed   Consultations Obtained: I  requested consultation with the trauma surgeon,  and discussed lab and imaging findings as well as pertinent plan - they recommend: admit to their service   Cardiac Monitoring: The patient was maintained on a cardiac monitor.  I personally viewed and interpreted the  cardiac monitored which showed an underlying rhythm of: NSR  Social Determinants of Health:  Diagnosis or treatment significantly limited by social determinants of health: obesity   Reevaluation: After the interventions noted above, I reevaluated the patient and found that they have improved  Co morbidities that complicate the patient evaluation  Past Medical History:  Diagnosis Date   Hypertension       Dispostion: Disposition decision including need for hospitalization was considered, and patient admitted to the hospital.    Final Clinical Impression(s) / ED Diagnoses Final diagnoses:  Multiple closed fractures of pelvis with unstable disruption of pelvic ring, initial encounter [facility])  Accident caused by farm tractor, initial encounter     This chart was dictated using voice recognition software.  Despite best efforts to proofread,  errors can occur which can change the documentation meaning.    Cristie Hem, MD 05/10/22 1758
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
## 2022-05-11 LAB — BASIC METABOLIC PANEL
Anion gap: 10 (ref 5–15)
BUN: 21 mg/dL — ABNORMAL HIGH (ref 6–20)
CO2: 24 mmol/L (ref 22–32)
Calcium: 8.3 mg/dL — ABNORMAL LOW (ref 8.9–10.3)
Chloride: 102 mmol/L (ref 98–111)
Creatinine, Ser: 1.13 mg/dL (ref 0.61–1.24)
GFR, Estimated: >60 mL/min (ref >60)
Glucose, Bld: 125 mg/dL — ABNORMAL HIGH (ref 70–99)
Potassium: 4.1 mmol/L (ref 3.5–5.1)
Sodium: 136 mmol/L (ref 135–145)

## 2022-05-11 LAB — CBC
HCT: 36.8 % — ABNORMAL LOW (ref 39.0–52.0)
Hemoglobin: 12.3 g/dL — ABNORMAL LOW (ref 13.0–17.0)
MCH: 28.1 pg (ref 26.0–34.0)
MCHC: 33.4 g/dL (ref 30.0–36.0)
MCV: 84 fL (ref 80.0–100.0)
Platelets: 147 10*3/uL — ABNORMAL LOW (ref 150–400)
RBC: 4.38 MIL/uL (ref 4.22–5.81)
RDW: 13.8 % (ref 11.5–15.5)
WBC: 7.4 10*3/uL (ref 4.0–10.5)
nRBC: 0 % (ref 0.0–0.2)

## 2022-05-11 LAB — SURGICAL PCR SCREEN
MRSA, PCR: NEGATIVE
Staphylococcus aureus: NEGATIVE

## 2022-05-11 LAB — HIV ANTIBODY (ROUTINE TESTING W REFLEX): HIV Screen 4th Generation wRfx: NONREACTIVE

## 2022-05-11 NOTE — Progress Notes (Signed)
Subjective:    Patient reports pain as mild.  Feeling well this am.   Objective: Vital signs in last 24 hours: Temp:  [95.5 F (35.3 C)-100 F (37.8 C)] 98 F (36.7 C) (02/18 0800) Pulse Rate:  [59-152] 73 (02/18 0800) Resp:  [10-22] 16 (02/18 0800) BP: (89-142)/(54-126) 115/72 (02/18 0800) SpO2:  [91 %-100 %] 91 % (02/18 0800) Weight:  [98.4 kg-109.8 kg] 98.4 kg (02/17 2000)  Intake/Output from previous day: 02/17 0701 - 02/18 0700 In: 2009.2 [P.O.:120; I.V.:510.8; Blood:378.3; IV Piggyback:1000] Out: 750 [Urine:750] Intake/Output this shift: Total I/O In: 50 [I.V.:50] Out: 85 [Urine:85]  Recent Labs    05/10/22 1650 05/10/22 1700 05/11/22 0500  HGB 13.0 13.6 12.3*   Recent Labs    05/10/22 1650 05/10/22 1700 05/11/22 0500  WBC 13.2*  --  7.4  RBC 4.63  --  4.38  HCT 40.7 40.0 36.8*  PLT 179  --  147*   Recent Labs    05/10/22 1650 05/10/22 1700 05/11/22 0500  NA 137 142 136  K 3.5 3.5 4.1  CL 102 105 102  CO2 24  --  24  BUN 17 18 21*  CREATININE 1.29* 1.20 1.13  GLUCOSE 109* 103* 125*  CALCIUM 8.7*  --  8.3*   Recent Labs    05/10/22 1650  INR 1.2    Neurologically intact Neurovascular intact Sensation intact distally Intact pulses distally Incision: scant drainage No cellulitis present Compartment soft No skin breakdown around pinsites   Assessment/Plan:    PLAN Continue traction to LLE Dr. Latanya Maudlin likely to take over care tomorrow Ok to resume diet today, but please make NPO after midnight for ? Surgery tomorrow       Aundra Dubin 05/11/2022, 9:44 AM

## 2022-05-11 NOTE — Plan of Care (Signed)
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
## 2022-05-12 ENCOUNTER — Inpatient Hospital Stay (HOSPITAL_COMMUNITY): Payer: 59 | Admitting: Certified Registered"

## 2022-05-12 ENCOUNTER — Inpatient Hospital Stay (HOSPITAL_COMMUNITY): Payer: 59

## 2022-05-12 ENCOUNTER — Encounter (HOSPITAL_COMMUNITY): Admission: EM | Disposition: A | Discharge status: Home Health | Payer: Self-pay | Source: Home / Self Care

## 2022-05-12 ENCOUNTER — Other Ambulatory Visit: Payer: Self-pay

## 2022-05-12 ENCOUNTER — Encounter (HOSPITAL_COMMUNITY): Payer: Self-pay | Admitting: Surgery

## 2022-05-12 DIAGNOSIS — I10 Essential (primary) hypertension: Secondary | ICD-10-CM | POA: Diagnosis not present

## 2022-05-12 DIAGNOSIS — S3282XA Multiple fractures of pelvis without disruption of pelvic ring, initial encounter for closed fracture: Secondary | ICD-10-CM | POA: Diagnosis not present

## 2022-05-12 HISTORY — PX: ORIF PELVIC FRACTURE WITH PERCUTANEOUS SCREWS: SHX6800

## 2022-05-12 SURGERY — CLOSED REDUCTION, PELVIS, WITH PERCUTANEOUS FIXATION
Anesthesia: General | Site: Pelvis | Laterality: Bilateral

## 2022-05-12 MED ORDER — HYDROMORPHONE HCL 1 MG/ML IJ SOLN
INTRAMUSCULAR | Status: AC
  Filled 2022-05-12: qty 1

## 2022-05-12 MED ORDER — LIDOCAINE 2% (20 MG/ML) 5 ML SYRINGE
INTRAMUSCULAR | Status: DC | PRN
  Administered 2022-05-12: 40 mg via INTRAVENOUS

## 2022-05-12 MED ORDER — KETAMINE HCL 50 MG/5ML IJ SOSY
PREFILLED_SYRINGE | INTRAMUSCULAR | Status: AC
  Filled 2022-05-12: qty 5

## 2022-05-12 MED ORDER — PROPOFOL 10 MG/ML IV BOLUS
INTRAVENOUS | Status: AC
  Filled 2022-05-12: qty 20

## 2022-05-12 MED ORDER — POLYETHYLENE GLYCOL 3350 17 G PO PACK: 17.0000 g | PACK | Freq: Every day | ORAL | Status: DC | PRN

## 2022-05-12 MED ORDER — PROMETHAZINE HCL 25 MG/ML IJ SOLN: 6.2500 mg | INTRAMUSCULAR | Status: DC | PRN

## 2022-05-12 MED ORDER — LIDOCAINE 2% (20 MG/ML) 5 ML SYRINGE
INTRAMUSCULAR | Status: AC
  Filled 2022-05-12: qty 5

## 2022-05-12 MED ORDER — FENTANYL CITRATE (PF) 250 MCG/5ML IJ SOLN
INTRAMUSCULAR | Status: AC
  Filled 2022-05-12: qty 5

## 2022-05-12 MED ORDER — ROCURONIUM BROMIDE 10 MG/ML (PF) SYRINGE
PREFILLED_SYRINGE | INTRAVENOUS | Status: AC
  Filled 2022-05-12: qty 10

## 2022-05-12 MED ORDER — MIDAZOLAM HCL 2 MG/2ML IJ SOLN
INTRAMUSCULAR | Status: DC | PRN
  Administered 2022-05-12: 2 mg via INTRAVENOUS

## 2022-05-12 MED ORDER — 0.9 % SODIUM CHLORIDE (POUR BTL) OPTIME
TOPICAL | Status: DC | PRN
  Administered 2022-05-12: 1000 mL

## 2022-05-12 MED ORDER — HYDROMORPHONE HCL 1 MG/ML IJ SOLN
0.2500 mg | INTRAMUSCULAR | Status: DC | PRN
  Administered 2022-05-12 (×3): 0.5 mg via INTRAVENOUS

## 2022-05-12 MED ORDER — METOCLOPRAMIDE HCL 5 MG/ML IJ SOLN: 5.0000 mg | Freq: Three times a day (TID) | INTRAMUSCULAR | Status: DC | PRN

## 2022-05-12 MED ORDER — ONDANSETRON HCL 4 MG/2ML IJ SOLN
INTRAMUSCULAR | Status: DC | PRN
  Administered 2022-05-12: 4 mg via INTRAVENOUS

## 2022-05-12 MED ORDER — CEFAZOLIN SODIUM-DEXTROSE 2-4 GM/100ML-% IV SOLN
2.0000 g | Freq: Once | INTRAVENOUS | Status: AC
  Administered 2022-05-12: 2 g via INTRAVENOUS
  Filled 2022-05-12: qty 100

## 2022-05-12 MED ORDER — DOCUSATE SODIUM 100 MG PO CAPS
100.0000 mg | ORAL_CAPSULE | Freq: Two times a day (BID) | ORAL | Status: DC
  Administered 2022-05-12 – 2022-05-15 (×6): 100 mg via ORAL
  Filled 2022-05-12 (×5): qty 1

## 2022-05-12 MED ORDER — DEXAMETHASONE SODIUM PHOSPHATE 10 MG/ML IJ SOLN
INTRAMUSCULAR | Status: DC | PRN
  Administered 2022-05-12: 10 mg via INTRAVENOUS

## 2022-05-12 MED ORDER — ROCURONIUM BROMIDE 10 MG/ML (PF) SYRINGE
PREFILLED_SYRINGE | INTRAVENOUS | Status: DC | PRN
  Administered 2022-05-12: 60 mg via INTRAVENOUS
  Administered 2022-05-12: 20 mg via INTRAVENOUS

## 2022-05-12 MED ORDER — PHENYLEPHRINE 80 MCG/ML (10ML) SYRINGE FOR IV PUSH (FOR BLOOD PRESSURE SUPPORT)
PREFILLED_SYRINGE | INTRAVENOUS | Status: DC | PRN
  Administered 2022-05-12 (×4): 80 ug via INTRAVENOUS

## 2022-05-12 MED ORDER — HYDROMORPHONE HCL 1 MG/ML IJ SOLN
0.5000 mg | Freq: Once | INTRAMUSCULAR | Status: AC
  Administered 2022-05-12: 0.5 mg via INTRAVENOUS

## 2022-05-12 MED ORDER — ONDANSETRON HCL 4 MG/2ML IJ SOLN
INTRAMUSCULAR | Status: AC
  Filled 2022-05-12: qty 2

## 2022-05-12 MED ORDER — SUGAMMADEX SODIUM 200 MG/2ML IV SOLN
INTRAVENOUS | Status: DC | PRN
  Administered 2022-05-12: 200 mg via INTRAVENOUS

## 2022-05-12 MED ORDER — PROPOFOL 10 MG/ML IV BOLUS
INTRAVENOUS | Status: DC | PRN
  Administered 2022-05-12: 200 mg via INTRAVENOUS

## 2022-05-12 MED ORDER — CEFAZOLIN SODIUM-DEXTROSE 2-4 GM/100ML-% IV SOLN
2.0000 g | Freq: Three times a day (TID) | INTRAVENOUS | Status: AC
  Administered 2022-05-12 – 2022-05-13 (×3): 2 g via INTRAVENOUS
  Filled 2022-05-12 (×3): qty 100

## 2022-05-12 MED ORDER — PHENYLEPHRINE HCL-NACL 20-0.9 MG/250ML-% IV SOLN
INTRAVENOUS | Status: DC | PRN
  Administered 2022-05-12: 25 ug/min via INTRAVENOUS

## 2022-05-12 MED ORDER — ACETAMINOPHEN 10 MG/ML IV SOLN
INTRAVENOUS | Status: AC
  Filled 2022-05-12: qty 100

## 2022-05-12 MED ORDER — MIDAZOLAM HCL 2 MG/2ML IJ SOLN
INTRAMUSCULAR | Status: AC
  Filled 2022-05-12: qty 2

## 2022-05-12 MED ORDER — LACTATED RINGERS IV SOLN: INTRAVENOUS | Status: DC

## 2022-05-12 MED ORDER — CHLORHEXIDINE GLUCONATE 0.12 % MT SOLN: 15.0000 mL | Freq: Once | OROMUCOSAL | Status: AC

## 2022-05-12 MED ORDER — DEXAMETHASONE SODIUM PHOSPHATE 10 MG/ML IJ SOLN
INTRAMUSCULAR | Status: AC
  Filled 2022-05-12: qty 1

## 2022-05-12 MED ORDER — FENTANYL CITRATE (PF) 250 MCG/5ML IJ SOLN
INTRAMUSCULAR | Status: DC | PRN
  Administered 2022-05-12 (×3): 50 ug via INTRAVENOUS

## 2022-05-12 MED ORDER — OXYCODONE HCL 5 MG PO TABS: 5.0000 mg | ORAL_TABLET | Freq: Once | ORAL | Status: DC | PRN

## 2022-05-12 MED ORDER — HYDROMORPHONE HCL 1 MG/ML IJ SOLN: 0.5000 mg | INTRAMUSCULAR | Status: DC | PRN

## 2022-05-12 MED ORDER — VANCOMYCIN HCL 1000 MG IV SOLR
INTRAVENOUS | Status: AC
  Filled 2022-05-12: qty 20

## 2022-05-12 MED ORDER — CHLORHEXIDINE GLUCONATE 0.12 % MT SOLN
OROMUCOSAL | Status: AC
  Administered 2022-05-12: 15 mL via OROMUCOSAL
  Filled 2022-05-12: qty 15

## 2022-05-12 MED ORDER — ORAL CARE MOUTH RINSE: 15.0000 mL | Freq: Once | OROMUCOSAL | Status: AC

## 2022-05-12 MED ORDER — METOCLOPRAMIDE HCL 5 MG PO TABS: 5.0000 mg | ORAL_TABLET | Freq: Three times a day (TID) | ORAL | Status: DC | PRN

## 2022-05-12 MED ORDER — KETAMINE HCL 10 MG/ML IJ SOLN
INTRAMUSCULAR | Status: DC | PRN
  Administered 2022-05-12: 10 mg via INTRAVENOUS
  Administered 2022-05-12: 30 mg via INTRAVENOUS

## 2022-05-12 MED ORDER — PHENYLEPHRINE 80 MCG/ML (10ML) SYRINGE FOR IV PUSH (FOR BLOOD PRESSURE SUPPORT)
PREFILLED_SYRINGE | INTRAVENOUS | Status: AC
  Filled 2022-05-12: qty 10

## 2022-05-12 MED ORDER — OXYCODONE HCL 5 MG/5ML PO SOLN: 5.0000 mg | Freq: Once | ORAL | Status: DC | PRN

## 2022-05-12 MED ORDER — ACETAMINOPHEN 10 MG/ML IV SOLN
INTRAVENOUS | Status: DC | PRN
  Administered 2022-05-12: 1000 mg via INTRAVENOUS

## 2022-05-12 SURGICAL SUPPLY — 49 items
BAG COUNTER SPONGE SURGICOUNT (BAG) ×1 IMPLANT
BIT DRILL CANN 4.5MM (BIT) IMPLANT
BIT DRILL QC LONG 5.0 (BIT) IMPLANT
BLADE CLIPPER SURG (BLADE) IMPLANT
BLADE SURG 11 STRL SS (BLADE) ×1 IMPLANT
CHLORAPREP W/TINT 26 (MISCELLANEOUS) ×1 IMPLANT
DERMABOND ADVANCED .7 DNX12 (GAUZE/BANDAGES/DRESSINGS) IMPLANT
DRAPE C-ARM 42X72 X-RAY (DRAPES) ×1 IMPLANT
DRAPE C-ARMOR (DRAPES) ×1 IMPLANT
DRAPE HALF SHEET 40X57 (DRAPES) ×1 IMPLANT
DRAPE INCISE IOBAN 66X45 STRL (DRAPES) ×1 IMPLANT
DRAPE SURG 17X23 STRL (DRAPES) ×6 IMPLANT
DRAPE U-SHAPE 47X51 STRL (DRAPES) ×1 IMPLANT
DRESSING MEPILEX FLEX 4X4 (GAUZE/BANDAGES/DRESSINGS) IMPLANT
DRSG MEPILEX BORDER 4X4 (GAUZE/BANDAGES/DRESSINGS) IMPLANT
DRSG MEPILEX BORDER 4X8 (GAUZE/BANDAGES/DRESSINGS) IMPLANT
DRSG MEPILEX FLEX 4X4 (GAUZE/BANDAGES/DRESSINGS) ×2
DRSG TEGADERM 4X4.75 (GAUZE/BANDAGES/DRESSINGS) IMPLANT
ELECT REM PT RETURN 9FT ADLT (ELECTROSURGICAL) ×1
ELECTRODE REM PT RTRN 9FT ADLT (ELECTROSURGICAL) ×1 IMPLANT
GAUZE SPONGE 2X2 8PLY STRL LF (GAUZE/BANDAGES/DRESSINGS) IMPLANT
GLOVE BIO SURGEON STRL SZ 6.5 (GLOVE) ×3 IMPLANT
GLOVE BIO SURGEON STRL SZ7.5 (GLOVE) ×4 IMPLANT
GLOVE BIOGEL PI IND STRL 6.5 (GLOVE) ×1 IMPLANT
GLOVE BIOGEL PI IND STRL 7.5 (GLOVE) ×1 IMPLANT
GOWN STRL REUS W/ TWL LRG LVL3 (GOWN DISPOSABLE) ×2 IMPLANT
GOWN STRL REUS W/TWL LRG LVL3 (GOWN DISPOSABLE) ×2
GUIDEWIRE 2.0MM (WIRE) IMPLANT
GUIDEWIRE 2.8 THREAD 450 L ST (WIRE) IMPLANT
KIT BASIN OR (CUSTOM PROCEDURE TRAY) ×1 IMPLANT
KIT TURNOVER KIT B (KITS) ×1 IMPLANT
MANIFOLD NEPTUNE II (INSTRUMENTS) ×1 IMPLANT
NS IRRIG 1000ML POUR BTL (IV SOLUTION) ×1 IMPLANT
PACK TOTAL JOINT (CUSTOM PROCEDURE TRAY) ×1 IMPLANT
PACK UNIVERSAL I (CUSTOM PROCEDURE TRAY) ×1 IMPLANT
PAD ARMBOARD 7.5X6 YLW CONV (MISCELLANEOUS) ×2 IMPLANT
SCREW CANN FT 7.5X155 STL (Screw) IMPLANT
SCREW CANN FT 7.5X75 (Screw) IMPLANT
SCREW CANN FT 7.5X90 (Screw) IMPLANT
SCREW CANN LT 7.5X140 (Screw) IMPLANT
SPONGE T-LAP 18X18 ~~LOC~~+RFID (SPONGE) IMPLANT
STAPLER VISISTAT 35W (STAPLE) ×1 IMPLANT
SUCTION FRAZIER HANDLE 10FR (MISCELLANEOUS) ×1
SUCTION TUBE FRAZIER 10FR DISP (MISCELLANEOUS) ×1 IMPLANT
SUT MNCRL AB 3-0 PS2 18 (SUTURE) ×1 IMPLANT
SUT MON AB 2-0 CT1 36 (SUTURE) ×1 IMPLANT
TRAY FOLEY MTR SLVR 16FR STAT (SET/KITS/TRAYS/PACK) IMPLANT
WASHER F/7.5 CANN SCREW (Washer) IMPLANT
WATER STERILE IRR 1000ML POUR (IV SOLUTION) ×1 IMPLANT
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
## 2022-05-12 NOTE — Anesthesia Procedure Notes (Signed)
Procedure Name: Intubation Date/Time: 05/12/2022 12:21 PM  Performed by: Carolan Clines, CRNAPre-anesthesia Checklist: Patient identified, Emergency Drugs available, Suction available and Patient being monitored Patient Re-evaluated:Patient Re-evaluated prior to induction Oxygen Delivery Method: Circle System Utilized Preoxygenation: Pre-oxygenation with 100% oxygen Induction Type: IV induction Ventilation: Mask ventilation without difficulty and Oral airway inserted - appropriate to patient size Laryngoscope Size: Mac and 4 Grade View: Grade II Tube type: Oral Tube size: 7.5 mm Number of attempts: 1 Airway Equipment and Method: Stylet and Oral airway Placement Confirmation: ETT inserted through vocal cords under direct vision, positive ETCO2 and breath sounds checked- equal and bilateral Secured at: 23 cm Tube secured with: Tape Dental Injury: Teeth and Oropharynx as per pre-operative assessment

## 2022-05-12 NOTE — Transfer of Care (Signed)
Immediate Anesthesia Transfer of Care Note  Patient: Matthew Gomez  Procedure(s) Performed: ORIF PELVIC FRACTURE WITH PERCUTANEOUS SCREWS (Bilateral: Pelvis)  Patient Location: PACU  Anesthesia Type:General  Level of Consciousness: drowsy  Airway & Oxygen Therapy: Patient Spontanous Breathing and Patient connected to face mask oxygen  Post-op Assessment: Report given to RN and Post -op Vital signs reviewed and stable  Post vital signs: Reviewed and stable  Last Vitals:  Vitals Value Taken Time  BP 144/80 05/12/22 1427  Temp    Pulse 79 05/12/22 1429  Resp    SpO2 100 % 05/12/22 1429  Vitals shown include unvalidated device data.  Last Pain:  Vitals:   05/12/22 1028  TempSrc: Oral  PainSc: 9       Patients Stated Pain Goal: 4 ([id number])  Complications: No notable events documented.

## 2022-05-12 NOTE — Anesthesia Postprocedure Evaluation (Signed)
Anesthesia Post Note  Patient: Matthew Gomez  Procedure(s) Performed: ORIF PELVIC FRACTURE WITH PERCUTANEOUS SCREWS (Bilateral: Pelvis)     Patient location during evaluation: PACU Anesthesia Type: General Level of consciousness: awake Pain management: pain level controlled Vital Signs Assessment: post-procedure vital signs reviewed and stable Respiratory status: spontaneous breathing, nonlabored ventilation and respiratory function stable Cardiovascular status: blood pressure returned to baseline and stable Postop Assessment: no apparent nausea or vomiting Anesthetic complications: no   No notable events documented.  Last Vitals:  Vitals:   05/12/22 1515 05/12/22 1528  BP: 125/72 133/80  Pulse: 73 77  Resp: 12 17  Temp:  37.1 C  SpO2: 96% 96%    Last Pain:  Vitals:   05/12/22 1528  TempSrc: Oral  PainSc:                  Nilda Simmer
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
## 2022-05-12 NOTE — Interval H&P Note (Signed)
History and Physical Interval Note:  05/12/2022 12:02 PM  Matthew Gomez  has presented today for surgery, with the diagnosis of Pelvic fractures.  The various methods of treatment have been discussed with the patient and family. After consideration of risks, benefits and other options for treatment, the patient has consented to  Procedure(s): ORIF PELVIC FRACTURE WITH PERCUTANEOUS SCREWS (Bilateral) as a surgical intervention.  The patient's history has been reviewed, patient examined, no change in status, stable for surgery.  I have reviewed the patient's chart and labs.  Questions were answered to the patient's satisfaction.     Lennette Bihari P Stacye Noori
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
## 2022-05-13 ENCOUNTER — Encounter (HOSPITAL_COMMUNITY): Payer: Self-pay | Admitting: Student

## 2022-05-13 ENCOUNTER — Inpatient Hospital Stay (HOSPITAL_COMMUNITY): Payer: 59

## 2022-05-13 LAB — CBC
HCT: 27.2 % — ABNORMAL LOW (ref 39.0–52.0)
Hemoglobin: 9.3 g/dL — ABNORMAL LOW (ref 13.0–17.0)
MCH: 28.4 pg (ref 26.0–34.0)
MCHC: 34.2 g/dL (ref 30.0–36.0)
MCV: 83.2 fL (ref 80.0–100.0)
Platelets: 114 10*3/uL — ABNORMAL LOW (ref 150–400)
RBC: 3.27 MIL/uL — ABNORMAL LOW (ref 4.22–5.81)
RDW: 13 % (ref 11.5–15.5)
WBC: 8.4 10*3/uL (ref 4.0–10.5)
nRBC: 0 % (ref 0.0–0.2)

## 2022-05-13 LAB — BASIC METABOLIC PANEL
Anion gap: 8 (ref 5–15)
BUN: 16 mg/dL (ref 6–20)
CO2: 26 mmol/L (ref 22–32)
Calcium: 8 mg/dL — ABNORMAL LOW (ref 8.9–10.3)
Chloride: 99 mmol/L (ref 98–111)
Creatinine, Ser: 1.05 mg/dL (ref 0.61–1.24)
GFR, Estimated: >60 mL/min (ref >60)
Glucose, Bld: 134 mg/dL — ABNORMAL HIGH (ref 70–99)
Potassium: 4.4 mmol/L (ref 3.5–5.1)
Sodium: 133 mmol/L — ABNORMAL LOW (ref 135–145)

## 2022-05-13 LAB — VITAMIN D 25 HYDROXY (VIT D DEFICIENCY, FRACTURES): Vit D, 25-Hydroxy: 17.1 ng/mL — ABNORMAL LOW (ref 30–100)

## 2022-05-13 MED ORDER — POLYETHYLENE GLYCOL 3350 17 G PO PACK
17.0000 g | PACK | Freq: Every day | ORAL | Status: DC
  Administered 2022-05-13 – 2022-05-15 (×3): 17 g via ORAL
  Filled 2022-05-13 (×3): qty 1
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
## 2022-05-13 NOTE — Progress Notes (Signed)
Orthopaedic Trauma Progress Note  SUBJECTIVE: Doing fairly well this morning.  Overall pain controlled.  No chest pain. No SOB. No nausea/vomiting.  Denies any numbness or tingling throughout bilateral lower extremities.  No other complaints.  CT scan of the pelvis done this morning.  Patient has not been up out of bed yet worked with therapy since surgery.  OBJECTIVE:  Vitals:   05/13/22 0310 05/13/22 0751  BP: 102/64 119/85  Pulse: (!) 54 84  Resp: 14 20  Temp: 97.9 F (36.6 C) 97.8 F (36.6 C)  SpO2: 99% 96%    General: Sitting up in bed, no acute distress Respiratory: No increased work of breathing.  Pelvis/BLE: Dressings clean, dry, intact.  Mild swelling and bruising to the scrotum.  No tenderness with palpation over bilateral hips.  Bilateral lower extremities.  Tolerates gentle ankle dorsiflexion plantarflexion bilaterally.  Endorses sensation to light touch bilateral lower extremities.  Neurovascularly intact.  IMAGING: Stable post op imaging.   LABS:  Results for orders placed or performed during the hospital encounter of 05/10/22 (from the past 24 hour(s))  CBC     Status: Abnormal   Collection Time: 05/13/22  3:16 AM  Result Value Ref Range   WBC 8.4 4.0 - 10.5 K/uL   RBC 3.27 (L) 4.22 - 5.81 MIL/uL   Hemoglobin 9.3 (L) 13.0 - 17.0 g/dL   HCT 27.2 (L) 39.0 - 52.0 %   MCV 83.2 80.0 - 100.0 fL   MCH 28.4 26.0 - 34.0 pg   MCHC 34.2 30.0 - 36.0 g/dL   RDW 13.0 11.5 - 15.5 %   Platelets 114 (L) 150 - 400 K/uL   nRBC 0.0 0.0 - 0.2 %  Basic metabolic panel     Status: Abnormal   Collection Time: 05/13/22  3:16 AM  Result Value Ref Range   Sodium 133 (L) 135 - 145 mmol/L   Potassium 4.4 3.5 - 5.1 mmol/L   Chloride 99 98 - 111 mmol/L   CO2 26 22 - 32 mmol/L   Glucose, Bld 134 (H) 70 - 99 mg/dL   BUN 16 6 - 20 mg/dL   Creatinine, Ser 1.05 0.61 - 1.24 mg/dL   Calcium 8.0 (L) 8.9 - 10.3 mg/dL   GFR, Estimated >60 >60 mL/min   Anion gap 8 5 - 15    ASSESSMENT:  Matthew Gomez is a 52 y.o. male, 1 Day Post-Op s/p ORIF PELVIC FRACTURE WITH PERCUTANEOUS SCREWS  CV/Blood loss: Acute blood loss anemia, Hgb 9.3 this morning. Hemodynamically stable  PLAN: Weightbearing: NWB LLE.  WBAT for transfers only RLE ROM: Okay for unrestricted hip and knee range of motion bilaterally Incisional and dressing care: Reinforce dressings as needed  Showering: Hold off on showering for now.  Okay to begin getting incisions wet 05/15/2022 Orthopedic device(s): None  Pain management:  1. Tylenol 1000 mg q 6 hours scheduled 2. Robaxin 1000 mg 4 times daily  3. Oxycodone 5-10 mg q 4 hours PRN 4. Dilaudid 0.5-1 mg q 3 hours PRN VTE prophylaxis: Lovenox, SCDs ID:  Ancef 2gm post op Foley/Lines: Foley in place.  Possible TOV today.  KVO IVFs Impediments to Fracture Healing: Vitamin D level pending, will start supplementation as indicated Dispo: PT/OT evaluation today, dispo pending.  Plan to remove dressings 05/14/2022   D/C recommendations: -Oxycodone, Robaxin, Tylenol for pain control -Eliquis 2 5 mg twice daily x 30 days for DVT prophylaxis -Possible need for Vit D supplementation  Follow - up plan: 2 weeks  after discharge for wound check and repeat x-rays   Contact information:  Katha Hamming MD, Rushie Nyhan PA-C. After hours and holidays please check [email or web address] for group call information for [facility]   Gwinda Passe, PA-C [phone] (office) [email or web address]
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
## 2022-05-14 LAB — TYPE AND SCREEN
ABO/RH(D): O POS
Antibody Screen: NEGATIVE
Unit division: 0
Unit division: 0
Unit division: 0

## 2022-05-14 LAB — CBC
HCT: 28.2 % — ABNORMAL LOW (ref 39.0–52.0)
Hemoglobin: 9.1 g/dL — ABNORMAL LOW (ref 13.0–17.0)
MCH: 27.8 pg (ref 26.0–34.0)
MCHC: 32.3 g/dL (ref 30.0–36.0)
MCV: 86.2 fL (ref 80.0–100.0)
Platelets: 157 10*3/uL (ref 150–400)
RBC: 3.27 MIL/uL — ABNORMAL LOW (ref 4.22–5.81)
RDW: 13.1 % (ref 11.5–15.5)
WBC: 8.5 10*3/uL (ref 4.0–10.5)
nRBC: 0 % (ref 0.0–0.2)

## 2022-05-14 LAB — BASIC METABOLIC PANEL
Anion gap: 7 (ref 5–15)
BUN: 16 mg/dL (ref 6–20)
CO2: 30 mmol/L (ref 22–32)
Calcium: 8 mg/dL — ABNORMAL LOW (ref 8.9–10.3)
Chloride: 100 mmol/L (ref 98–111)
Creatinine, Ser: 0.95 mg/dL (ref 0.61–1.24)
GFR, Estimated: >60 mL/min (ref >60)
Glucose, Bld: 112 mg/dL — ABNORMAL HIGH (ref 70–99)
Potassium: 3.8 mmol/L (ref 3.5–5.1)
Sodium: 137 mmol/L (ref 135–145)

## 2022-05-14 LAB — BPAM RBC
Blood Product Expiration Date: 202403222359
Blood Product Expiration Date: 202403222359
Blood Product Expiration Date: 202403222359
ISSUE DATE / TIME: 202402171910
ISSUE DATE / TIME: 202402171910
Unit Type and Rh: 5100
Unit Type and Rh: 5100
Unit Type and Rh: 5100

## 2022-05-14 MED ORDER — ACETAMINOPHEN 500 MG PO TABS
1000.0000 mg | ORAL_TABLET | Freq: Four times a day (QID) | ORAL | Status: DC
  Administered 2022-05-14 – 2022-05-15 (×5): 1000 mg via ORAL
  Filled 2022-05-14 (×5): qty 2

## 2022-05-14 MED ORDER — KETOROLAC TROMETHAMINE 30 MG/ML IJ SOLN
30.0000 mg | Freq: Four times a day (QID) | INTRAMUSCULAR | Status: DC
  Administered 2022-05-14 – 2022-05-15 (×5): 30 mg via INTRAVENOUS
  Filled 2022-05-14 (×5): qty 1

## 2022-05-14 MED ORDER — VITAMIN D (ERGOCALCIFEROL) 1.25 MG (50000 UNIT) PO CAPS
50000.0000 [IU] | ORAL_CAPSULE | ORAL | Status: DC
  Administered 2022-05-14: 50000 [IU] via ORAL
  Filled 2022-05-14: qty 1

## 2022-05-14 NOTE — Progress Notes (Signed)
Orthopaedic Trauma Progress Note  SUBJECTIVE: Patient sleeping comfortably in bed this afternoon with lights off.  Did not wake him for exam.  Per trauma notes earlier today, having more pain today and was unable to sleep well last night due to the pain.  Some adjustments to his medication were made.    OBJECTIVE:  Vitals:   05/14/22 0748 05/14/22 1141  BP: 119/66 110/60  Pulse: 61 70  Resp: 16 19  Temp: 99 F (37.2 C) 98.7 F (37.1 C)  SpO2: 91% 95%    General: Sleeping comfortably in bed.  No acute distress Respiratory: No increased work of breathing.  Pelvis/BLE: Exam not completed due to patient sleeping  IMAGING: Postoperative pelvic CT scan stable.  LABS:  Results for orders placed or performed during the hospital encounter of 05/10/22 (from the past 24 hour(s))  CBC     Status: Abnormal   Collection Time: 05/14/22 12:15 AM  Result Value Ref Range   WBC 8.5 4.0 - 10.5 K/uL   RBC 3.27 (L) 4.22 - 5.81 MIL/uL   Hemoglobin 9.1 (L) 13.0 - 17.0 g/dL   HCT 28.2 (L) 39.0 - 52.0 %   MCV 86.2 80.0 - 100.0 fL   MCH 27.8 26.0 - 34.0 pg   MCHC 32.3 30.0 - 36.0 g/dL   RDW 13.1 11.5 - 15.5 %   Platelets 157 150 - 400 K/uL   nRBC 0.0 0.0 - 0.2 %  Basic metabolic panel     Status: Abnormal   Collection Time: 05/14/22 12:15 AM  Result Value Ref Range   Sodium 137 135 - 145 mmol/L   Potassium 3.8 3.5 - 5.1 mmol/L   Chloride 100 98 - 111 mmol/L   CO2 30 22 - 32 mmol/L   Glucose, Bld 112 (H) 70 - 99 mg/dL   BUN 16 6 - 20 mg/dL   Creatinine, Ser 0.95 0.61 - 1.24 mg/dL   Calcium 8.0 (L) 8.9 - 10.3 mg/dL   GFR, Estimated >60 >60 mL/min   Anion gap 7 5 - 15    ASSESSMENT: Matthew Gomez is a 52 y.o. male, 2 Days Post-Op s/p ORIF PELVIC FRACTURE WITH PERCUTANEOUS SCREWS  CV/Blood loss: Acute blood loss anemia, Hgb 9.1 this morning. Hemodynamically stable  PLAN: Weightbearing: NWB LLE.  WBAT for transfers only RLE ROM: Okay for unrestricted hip and knee range of motion  bilaterally Incisional and dressing care: Okay to leave incisions open to air change as needed Showering: Hold off on showering for now.  Okay to begin getting incisions wet 05/15/2022 Orthopedic device(s): None  Pain management:  1. Tylenol 1000 mg q 6 hours scheduled 2. Robaxin 1000 mg 4 times daily  3. Oxycodone 5-10 mg q 4 hours PRN 4. Dilaudid 0.5-1 mg q 4 hours PRN 5. Toradol 30 mg every 6 hours x 5 days VTE prophylaxis: Lovenox, SCDs ID:  Ancef 2gm post op completed Foley/Lines: Foley removed.  KVO IVFs Impediments to Fracture Healing: Vitamin D level 17, will start on D2 supplementation Dispo: PT/OT evaluation ongoing, currently recommending home health therapies.  Will allow patient to sleep now and plan to follow-up with patient later today versus tomorrow regarding pain control.  Okay for discharge from orthopedic standpoint once cleared by trauma team and therapies   D/C recommendations: -Oxycodone, Robaxin, Tylenol for pain control -Eliquis 2 5 mg twice daily x 30 days for DVT prophylaxis -Continue 50,000 units Vit D supplementation q. 7 days x 5 weeks  Follow - up plan:  2 weeks after discharge for wound check and repeat x-rays   Contact information:  Katha Hamming MD, Rushie Nyhan PA-C. After hours and holidays please check [email or web address] for group call information for [facility]   Gwinda Passe, PA-C [phone] (office) [email or web address]
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
## 2022-05-14 NOTE — Progress Notes (Signed)
Patient said he will have some disability paperwork for doctor to sign tomorrow.
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
## 2022-05-14 NOTE — Progress Notes (Cosign Needed Addendum)
    Durable Medical Equipment  (From admission, onward)           Start     Ordered   05/14/22 1014  For home use only DME Bedside commode  Once       Question:  Patient needs a bedside commode to treat with the following condition  Answer:  Multiple pelvic fractures ([place])   05/14/22 1014   05/14/22 1014  For home use only DME standard manual wheelchair with seat cushion  Once       Comments: Patient suffers from pelvic fractures which impairs their ability to perform daily activities like bathing and toileting in the home.  A cane, crutch, or walker will not resolve issue with performing activities of daily living. A wheelchair will allow patient to safely perform daily activities. Patient can safely propel the wheelchair in the home or has a caregiver who can provide assistance. Length of need 6 months . Accessories: elevating leg rests (ELRs), wheel locks, extensions and anti-tippers.   05/14/22 1014   05/13/22 1332  For home use only DME Tub bench  Once        05/13/22 1333   05/13/22 1332  For home use only DME Walker rolling  Once       Question Answer Comment  Walker: With [place]   Patient needs a walker to treat with the following condition Multiple pelvic fractures ([place])      05/13/22 1333              Patient is confined to one room and is unable to ambulate to the bathroom, therefore needing a commode at the bedside.    Reinaldo Raddle, RN, BSN  Trauma/Neuro ICU Case Manager [phone]

## 2022-05-15 MED ORDER — MAGNESIUM HYDROXIDE 400 MG/5ML PO SUSP
30.0000 mL | Freq: Once | ORAL | Status: AC
  Administered 2022-05-15: 30 mL via ORAL
  Filled 2022-05-15: qty 30

## 2022-05-15 MED ORDER — BISACODYL 10 MG RE SUPP: 10.0000 mg | Freq: Every day | RECTAL | Status: DC | PRN

## 2022-05-15 MED ORDER — POLYETHYLENE GLYCOL 3350 17 G PO PACK: 17.0000 g | PACK | Freq: Two times a day (BID) | ORAL | 0 refills | Status: AC

## 2022-05-15 MED ORDER — OXYCODONE HCL 10 MG PO TABS: 5.0000 mg | ORAL_TABLET | Freq: Four times a day (QID) | ORAL | 0 refills | Status: AC | PRN

## 2022-05-15 MED ORDER — ACETAMINOPHEN 500 MG PO TABS: 1000.0000 mg | ORAL_TABLET | Freq: Four times a day (QID) | ORAL | 0 refills | Status: AC | PRN

## 2022-05-15 MED ORDER — METHOCARBAMOL 500 MG PO TABS: 1000.0000 mg | ORAL_TABLET | Freq: Four times a day (QID) | ORAL | 0 refills | Status: DC | PRN

## 2022-05-15 MED ORDER — APIXABAN 2.5 MG PO TABS: 2.5000 mg | ORAL_TABLET | Freq: Two times a day (BID) | ORAL | 0 refills | Status: AC

## 2022-05-15 MED ORDER — VITAMIN D (ERGOCALCIFEROL) 1.25 MG (50000 UNIT) PO CAPS: 50000.0000 [IU] | ORAL_CAPSULE | ORAL | 0 refills | Status: AC

## 2022-05-15 MED ORDER — POLYETHYLENE GLYCOL 3350 17 G PO PACK: 17.0000 g | PACK | Freq: Two times a day (BID) | ORAL | Status: DC

## 2022-05-15 MED ORDER — DOCUSATE SODIUM 100 MG PO CAPS: 100.0000 mg | ORAL_CAPSULE | Freq: Two times a day (BID) | ORAL | 0 refills | Status: AC

## 2022-05-15 NOTE — TOC Transition Note (Addendum)
Transition of Care [facility]) - CM/SW Discharge Note   Patient Details  Name: Matthew Gomez MRN: [id number] Date of Birth: 07/24/1970  Transition of [facility]) CM/SW Contact:  Ella Bodo, RN Phone Number: 05/15/2022, 3:35 PM   Clinical Narrative:    Pt medically stable for discharge home today with brother and daughter's mother to provide needed assistance.  All needed DME has been delivered to room except tub bench, as it is not covered by insurance; patient states he will purchase on [place], as it will be less expensive.  Brother and cousin participated in PT/OT education session.  [facility] aware of discharge home today, and anticipate home visit on 05/16/22.  Patient's FMLA forms completed, signed by MD and faxed to his employer.  Original forms given back to patient.  No other dc needs identified.    Final next level of care: Home w Home Health Services Barriers to Discharge: Barriers Resolved   Patient Goals and CMS Choice CMS Medicare.gov Compare Post Acute Care list provided to:: Patient Choice offered to / list presented to : Patient                         Discharge Plan and Services Additional resources added to the After Visit Summary for  NA   Discharge Planning Services: CM Consult Post Acute Care Choice: Home Health          DME Arranged: Bedside commode, Walker rolling, Wheelchair manual, Tub bench   Date DME Agency Contacted: 05/14/22 Time DME Agency Contacted: 1330 Representative spoke with at DME Agency: Brenton Grills HH Arranged: PT, OT          Social Determinants of Health ([place]) Interventions SDOH Screenings   Tobacco Use: Low Risk  (05/13/2022)     Readmission Risk Interventions     No data to display         Reinaldo Raddle, RN, BSN  Trauma/Neuro ICU Case Manager [phone]
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
## 2022-05-15 NOTE — Discharge Instructions (Signed)
Orthopaedic Trauma Service Discharge Instructions   General Discharge Instructions  WEIGHT BEARING STATUS:Non-weightbearing left lower extremity. Ok to use right leg to stand and transfers as tolerated. Do not want any ambulation on the right lower extremity  RANGE OF MOTION/ACTIVITY: Ok for unrestricted hip range of motion as tolerated  Wound Care: Incisions can be left open to air if there is no drainage. Once the incision is completely dry and without drainage, it may be left open to air out.  Showering may begin post-op day #3, (Thursday 05/15/22).  Clean incision gently with soap and water.  DVT/PE prophylaxis:  Eliquis 2.5 mg twice daily x 30 days  Diet: as you were eating previously.  Can use over the counter stool softeners and bowel preparations, such as Miralax, to help with bowel movements.  Narcotics can be constipating.  Be sure to drink plenty of fluids  PAIN MEDICATION USE AND EXPECTATIONS  You have likely been given narcotic medications to help control your pain.  After a traumatic event that results in an fracture (broken bone) with or without surgery, it is ok to use narcotic pain medications to help control one's pain.  We understand that everyone responds to pain differently and each individual patient will be evaluated on a regular basis for the continued need for narcotic medications. Ideally, narcotic medication use should last no more than 6-8 weeks (coinciding with fracture healing).   As a patient it is your responsibility as well to monitor narcotic medication use and report the amount and frequency you use these medications when you come to your office visit.   We would also advise that if you are using narcotic medications, you should take a dose prior to therapy to maximize you participation.  IF YOU ARE ON NARCOTIC MEDICATIONS IT IS NOT PERMISSIBLE TO OPERATE A MOTOR VEHICLE (MOTORCYCLE/CAR/TRUCK/MOPED) OR HEAVY MACHINERY DO NOT MIX NARCOTICS WITH OTHER CNS  (CENTRAL NERVOUS SYSTEM) DEPRESSANTS SUCH AS ALCOHOL   STOP SMOKING OR USING NICOTINE PRODUCTS!!!!  As discussed nicotine severely impairs your body's ability to heal surgical and traumatic wounds but also impairs bone healing.  Wounds and bone heal by forming microscopic blood vessels (angiogenesis) and nicotine is a vasoconstrictor (essentially, shrinks blood vessels).  Therefore, if vasoconstriction occurs to these microscopic blood vessels they essentially disappear and are unable to deliver necessary nutrients to the healing tissue.  This is one modifiable factor that you can do to dramatically increase your chances of healing your injury.    (This means no smoking, no nicotine gum, patches, etc)  DO NOT USE NONSTEROIDAL ANTI-INFLAMMATORY DRUGS (NSAID'S)  Using products such as Advil (ibuprofen), Aleve (naproxen), Motrin (ibuprofen) for additional pain control during fracture healing can delay and/or prevent the healing response.  If you would like to take over the counter (OTC) medication, Tylenol (acetaminophen) is ok.  However, some narcotic medications that are given for pain control contain acetaminophen as well. Therefore, you should not exceed more than 4000 mg of tylenol in a day if you do not have liver disease.  Also note that there are may OTC medicines, such as cold medicines and allergy medicines that my contain tylenol as well.  If you have any questions about medications and/or interactions please ask your doctor/PA or your pharmacist.      ICE AND ELEVATE INJURED/OPERATIVE EXTREMITY  Using ice and elevating the injured extremity above your heart can help with swelling and pain control.  Icing in a pulsatile fashion, such as 20 minutes  on and 20 minutes off, can be followed.    Do not place ice directly on skin. Make sure there is a barrier between to skin and the ice pack.    Using frozen items such as frozen peas works well as the conform nicely to the are that needs to be  iced.  USE AN ACE WRAP OR TED HOSE FOR SWELLING CONTROL  In addition to icing and elevation, Ace wraps or TED hose are used to help limit and resolve swelling.  It is recommended to use Ace wraps or TED hose until you are informed to stop.    When using Ace Wraps start the wrapping distally (farthest away from the body) and wrap proximally (closer to the body)   Example: If you had surgery on your leg or thing and you do not have a splint on, start the ace wrap at the toes and work your way up to the thigh        If you had surgery on your upper extremity and do not have a splint on, start the ace wrap at your fingers and work your way up to the upper arm  CALL THE OFFICE  FOR MEDICATION REFILL OR WITH ANY QUESTIONS/CONCERNS: [phone]   VISIT OUR WEBSITE FOR ADDITIONAL INFORMATION: [email or web address]     Discharge Wound Care Instructions  Do NOT apply any ointments, solutions or lotions to pin sites or surgical wounds.  These prevent needed drainage and even though solutions like hydrogen peroxide kill bacteria, they also damage cells lining the pin sites that help fight infection.  Applying lotions or ointments can keep the wounds moist and can cause them to breakdown and open up as well. This can increase the risk for infection. When in doubt call the office.  If any drainage is noted, use FOAM DRESSINGS - These dressing supplies should be available at local medical supply stores [facility], [facility], etc) as well as Management consultant ([facility], [facility], [employer], etc)  Once the incision is completely dry and without drainage, it may be left open to air out.  Showering may begin 36-48 hours later.  Cleaning gently with soap and water.

## 2022-05-30 ENCOUNTER — Other Ambulatory Visit: Payer: Self-pay | Admitting: Physician Assistant

## 2022-05-30 NOTE — Progress Notes (Deleted)
Patient called the after hours orthopedics emergency line requesting more pain medication. He had surgery with Dr. Doreatha Martin on 05/12/2022. Refill was sent to patient's pharmacy.

## 2022-05-31 ENCOUNTER — Other Ambulatory Visit (HOSPITAL_COMMUNITY): Payer: Self-pay | Admitting: Physician Assistant

## 2022-05-31 ENCOUNTER — Other Ambulatory Visit: Payer: Self-pay | Admitting: Physician Assistant

## 2022-05-31 DIAGNOSIS — Z8781 Personal history of (healed) traumatic fracture: Secondary | ICD-10-CM

## 2022-05-31 MED ORDER — METHOCARBAMOL 500 MG PO TABS: 1000.0000 mg | ORAL_TABLET | Freq: Four times a day (QID) | ORAL | 0 refills | Status: AC | PRN

## 2022-05-31 NOTE — Progress Notes (Signed)
Patient called the after hours orthopedic emergency line requesting more Robaxin. Patient had surgery with Dr. Doreatha Martin on 05/12/22. Refill was sent to patient's pharmacy.   Noemi Chapel, PA-C 05/31/22
